# Patient Record
Sex: Male | Born: 1945 | Race: White | Hispanic: No | Marital: Married | Smoking: Never smoker
Health system: Southern US, Community
[De-identification: ages and names within clinical notes are randomized; demographics above are authoritative.]

## PROBLEM LIST (undated history)

## (undated) DIAGNOSIS — E7800 Pure hypercholesterolemia, unspecified: Secondary | ICD-10-CM

## (undated) DIAGNOSIS — I10 Essential (primary) hypertension: Secondary | ICD-10-CM

## (undated) DIAGNOSIS — E119 Type 2 diabetes mellitus without complications: Secondary | ICD-10-CM

## (undated) HISTORY — PX: COLONOSCOPY: SHX174

---

## 2005-07-31 ENCOUNTER — Ambulatory Visit: Payer: Self-pay | Admitting: Internal Medicine

## 2005-08-16 ENCOUNTER — Ambulatory Visit: Payer: Self-pay | Admitting: Internal Medicine

## 2005-10-02 ENCOUNTER — Ambulatory Visit: Payer: Self-pay | Admitting: Gastroenterology

## 2005-10-26 ENCOUNTER — Encounter (INDEPENDENT_AMBULATORY_CARE_PROVIDER_SITE_OTHER): Payer: Self-pay | Admitting: *Deleted

## 2005-10-26 ENCOUNTER — Ambulatory Visit (HOSPITAL_COMMUNITY): Admission: RE | Admit: 2005-10-26 | Discharge: 2005-10-26 | Payer: Self-pay | Admitting: Gastroenterology

## 2009-07-08 ENCOUNTER — Encounter: Admission: RE | Admit: 2009-07-08 | Discharge: 2009-07-08 | Payer: Self-pay | Admitting: Family Medicine

## 2009-08-11 ENCOUNTER — Encounter: Admission: RE | Admit: 2009-08-11 | Discharge: 2009-08-11 | Payer: Self-pay | Admitting: Family Medicine

## 2013-04-03 ENCOUNTER — Ambulatory Visit
Admission: RE | Admit: 2013-04-03 | Discharge: 2013-04-03 | Disposition: A | Discharge status: Home / Self Care | Payer: BC Managed Care – PPO | Source: Ambulatory Visit | Attending: Family Medicine | Admitting: Family Medicine

## 2013-04-03 ENCOUNTER — Other Ambulatory Visit: Payer: Self-pay | Admitting: Family Medicine

## 2013-04-03 DIAGNOSIS — M25562 Pain in left knee: Secondary | ICD-10-CM

## 2014-03-22 ENCOUNTER — Other Ambulatory Visit: Payer: Self-pay | Admitting: Family Medicine

## 2015-04-29 ENCOUNTER — Ambulatory Visit: Payer: Self-pay | Admitting: *Deleted

## 2015-12-06 ENCOUNTER — Telehealth: Payer: Self-pay | Admitting: Internal Medicine

## 2015-12-06 ENCOUNTER — Encounter: Payer: Self-pay | Admitting: Internal Medicine

## 2015-12-06 NOTE — Telephone Encounter (Signed)
I tried to call patient to inform him that he is due for recall colon but both #s on file are incorrect. Letter sent.

## 2016-07-17 ENCOUNTER — Other Ambulatory Visit: Payer: Self-pay | Admitting: Gastroenterology

## 2016-07-17 DIAGNOSIS — K7469 Other cirrhosis of liver: Secondary | ICD-10-CM

## 2016-07-18 ENCOUNTER — Other Ambulatory Visit: Payer: Self-pay | Admitting: Gastroenterology

## 2018-01-06 ENCOUNTER — Other Ambulatory Visit: Payer: Self-pay | Admitting: Gastroenterology

## 2018-01-06 DIAGNOSIS — B1710 Acute hepatitis C without hepatic coma: Secondary | ICD-10-CM

## 2018-01-10 ENCOUNTER — Ambulatory Visit (HOSPITAL_COMMUNITY): Payer: Self-pay

## 2018-01-13 ENCOUNTER — Ambulatory Visit (HOSPITAL_COMMUNITY)
Admission: RE | Admit: 2018-01-13 | Discharge: 2018-01-13 | Disposition: A | Discharge status: Home / Self Care | Payer: Medicare Other | Source: Ambulatory Visit | Attending: Gastroenterology | Admitting: Gastroenterology

## 2018-01-13 DIAGNOSIS — B1710 Acute hepatitis C without hepatic coma: Secondary | ICD-10-CM | POA: Insufficient documentation

## 2018-01-29 ENCOUNTER — Other Ambulatory Visit: Payer: Self-pay | Admitting: Family Medicine

## 2018-01-30 ENCOUNTER — Other Ambulatory Visit: Payer: Self-pay | Admitting: Family Medicine

## 2018-08-20 ENCOUNTER — Other Ambulatory Visit: Payer: Self-pay | Admitting: Family Medicine

## 2018-08-20 DIAGNOSIS — Z136 Encounter for screening for cardiovascular disorders: Secondary | ICD-10-CM

## 2019-10-18 ENCOUNTER — Emergency Department (HOSPITAL_COMMUNITY): Payer: Medicare Other

## 2019-10-18 ENCOUNTER — Other Ambulatory Visit: Payer: Self-pay

## 2019-10-18 ENCOUNTER — Observation Stay (HOSPITAL_COMMUNITY)
Admission: EM | Admit: 2019-10-18 | Discharge: 2019-10-19 | Disposition: A | Discharge status: Home / Self Care | Payer: Medicare Other | Attending: Internal Medicine | Admitting: Internal Medicine

## 2019-10-18 ENCOUNTER — Encounter (HOSPITAL_COMMUNITY): Payer: Self-pay | Admitting: Emergency Medicine

## 2019-10-18 DIAGNOSIS — I160 Hypertensive urgency: Secondary | ICD-10-CM | POA: Diagnosis not present

## 2019-10-18 DIAGNOSIS — J341 Cyst and mucocele of nose and nasal sinus: Secondary | ICD-10-CM | POA: Insufficient documentation

## 2019-10-18 DIAGNOSIS — D72829 Elevated white blood cell count, unspecified: Secondary | ICD-10-CM | POA: Diagnosis present

## 2019-10-18 DIAGNOSIS — I700 Atherosclerosis of aorta: Secondary | ICD-10-CM | POA: Diagnosis not present

## 2019-10-18 DIAGNOSIS — Z9114 Patient's other noncompliance with medication regimen: Secondary | ICD-10-CM | POA: Diagnosis not present

## 2019-10-18 DIAGNOSIS — Z7982 Long term (current) use of aspirin: Secondary | ICD-10-CM | POA: Diagnosis not present

## 2019-10-18 DIAGNOSIS — R4701 Aphasia: Secondary | ICD-10-CM | POA: Diagnosis present

## 2019-10-18 DIAGNOSIS — R262 Difficulty in walking, not elsewhere classified: Secondary | ICD-10-CM | POA: Diagnosis not present

## 2019-10-18 DIAGNOSIS — Z7984 Long term (current) use of oral hypoglycemic drugs: Secondary | ICD-10-CM | POA: Diagnosis not present

## 2019-10-18 DIAGNOSIS — Z79899 Other long term (current) drug therapy: Secondary | ICD-10-CM | POA: Diagnosis not present

## 2019-10-18 DIAGNOSIS — R519 Headache, unspecified: Secondary | ICD-10-CM

## 2019-10-18 DIAGNOSIS — Z888 Allergy status to other drugs, medicaments and biological substances status: Secondary | ICD-10-CM | POA: Insufficient documentation

## 2019-10-18 DIAGNOSIS — E7800 Pure hypercholesterolemia, unspecified: Secondary | ICD-10-CM | POA: Insufficient documentation

## 2019-10-18 DIAGNOSIS — Z823 Family history of stroke: Secondary | ICD-10-CM | POA: Diagnosis not present

## 2019-10-18 DIAGNOSIS — Z880 Allergy status to penicillin: Secondary | ICD-10-CM | POA: Insufficient documentation

## 2019-10-18 DIAGNOSIS — Z20822 Contact with and (suspected) exposure to covid-19: Secondary | ICD-10-CM | POA: Diagnosis not present

## 2019-10-18 DIAGNOSIS — E785 Hyperlipidemia, unspecified: Secondary | ICD-10-CM | POA: Diagnosis not present

## 2019-10-18 DIAGNOSIS — E119 Type 2 diabetes mellitus without complications: Secondary | ICD-10-CM

## 2019-10-18 DIAGNOSIS — E1151 Type 2 diabetes mellitus with diabetic peripheral angiopathy without gangrene: Secondary | ICD-10-CM | POA: Insufficient documentation

## 2019-10-18 DIAGNOSIS — E1165 Type 2 diabetes mellitus with hyperglycemia: Secondary | ICD-10-CM

## 2019-10-18 DIAGNOSIS — G459 Transient cerebral ischemic attack, unspecified: Secondary | ICD-10-CM | POA: Diagnosis not present

## 2019-10-18 DIAGNOSIS — I10 Essential (primary) hypertension: Secondary | ICD-10-CM | POA: Diagnosis not present

## 2019-10-18 DIAGNOSIS — I161 Hypertensive emergency: Secondary | ICD-10-CM | POA: Diagnosis present

## 2019-10-18 HISTORY — DX: Pure hypercholesterolemia, unspecified: E78.00

## 2019-10-18 HISTORY — DX: Type 2 diabetes mellitus without complications: E11.9

## 2019-10-18 HISTORY — DX: Essential (primary) hypertension: I10

## 2019-10-18 LAB — COMPREHENSIVE METABOLIC PANEL
ALT: 18 U/L (ref 0–44)
AST: 21 U/L (ref 15–41)
Albumin: 4.4 g/dL (ref 3.5–5.0)
Alkaline Phosphatase: 51 U/L (ref 38–126)
Anion gap: 13 (ref 5–15)
BUN: 15 mg/dL (ref 8–23)
CO2: 23 mmol/L (ref 22–32)
Calcium: 9.5 mg/dL (ref 8.9–10.3)
Chloride: 99 mmol/L (ref 98–111)
Creatinine, Ser: 0.91 mg/dL (ref 0.61–1.24)
GFR calc Af Amer: >60 mL/min (ref >60)
GFR calc non Af Amer: >60 mL/min (ref >60)
Glucose, Bld: 203 mg/dL — ABNORMAL HIGH (ref 70–99)
Potassium: 4.3 mmol/L (ref 3.5–5.1)
Sodium: 135 mmol/L (ref 135–145)
Total Bilirubin: 1.2 mg/dL (ref 0.3–1.2)
Total Protein: 7.7 g/dL (ref 6.5–8.1)

## 2019-10-18 LAB — DIFFERENTIAL
Abs Immature Granulocytes: 0.06 10*3/uL (ref 0.00–0.07)
Basophils Absolute: 0 10*3/uL (ref 0.0–0.1)
Basophils Relative: 0 %
Eosinophils Absolute: 0 10*3/uL (ref 0.0–0.5)
Eosinophils Relative: 0 %
Immature Granulocytes: 0 %
Lymphocytes Relative: 10 %
Lymphs Abs: 1.3 10*3/uL (ref 0.7–4.0)
Monocytes Absolute: 0.7 10*3/uL (ref 0.1–1.0)
Monocytes Relative: 5 %
Neutro Abs: 11.8 10*3/uL — ABNORMAL HIGH (ref 1.7–7.7)
Neutrophils Relative %: 85 %

## 2019-10-18 LAB — CBC
HCT: 46 % (ref 39.0–52.0)
Hemoglobin: 15.4 g/dL (ref 13.0–17.0)
MCH: 29.8 pg (ref 26.0–34.0)
MCHC: 33.5 g/dL (ref 30.0–36.0)
MCV: 89 fL (ref 80.0–100.0)
Platelets: 186 10*3/uL (ref 150–400)
RBC: 5.17 MIL/uL (ref 4.22–5.81)
RDW: 12.4 % (ref 11.5–15.5)
WBC: 13.9 10*3/uL — ABNORMAL HIGH (ref 4.0–10.5)
nRBC: 0 % (ref 0.0–0.2)

## 2019-10-18 LAB — TROPONIN I (HIGH SENSITIVITY)
Troponin I (High Sensitivity): 6 ng/L (ref <18)
Troponin I (High Sensitivity): 5 ng/L (ref <18)

## 2019-10-18 LAB — PROTIME-INR
INR: 1 (ref 0.8–1.2)
Prothrombin Time: 12.5 seconds (ref 11.4–15.2)

## 2019-10-18 LAB — CBG MONITORING, ED: Glucose-Capillary: 164 mg/dL — ABNORMAL HIGH (ref 70–99)

## 2019-10-18 LAB — SARS CORONAVIRUS 2 BY RT PCR (HOSPITAL ORDER, PERFORMED IN ~~LOC~~ HOSPITAL LAB): SARS Coronavirus 2: NEGATIVE

## 2019-10-18 MED ORDER — ACETAMINOPHEN 325 MG PO TABS
650.0000 mg | ORAL_TABLET | Freq: Once | ORAL | Status: AC
  Administered 2019-10-18: 650 mg via ORAL
  Filled 2019-10-18: qty 2

## 2019-10-18 MED ORDER — LORAZEPAM 1 MG PO TABS
1.0000 mg | ORAL_TABLET | Freq: Once | ORAL | Status: AC
  Administered 2019-10-18: 1 mg via SUBLINGUAL
  Filled 2019-10-18: qty 1

## 2019-10-18 MED ORDER — INSULIN ASPART 100 UNIT/ML ~~LOC~~ SOLN
0.0000 [IU] | Freq: Every day | SUBCUTANEOUS | Status: DC
  Administered 2019-10-18: 2 [IU] via SUBCUTANEOUS

## 2019-10-18 MED ORDER — HYDRALAZINE HCL 20 MG/ML IJ SOLN
10.0000 mg | INTRAMUSCULAR | Status: DC | PRN
  Administered 2019-10-18: 10 mg via INTRAVENOUS
  Filled 2019-10-18: qty 1

## 2019-10-18 MED ORDER — SODIUM CHLORIDE 0.9% FLUSH
3.0000 mL | Freq: Once | INTRAVENOUS | Status: AC
  Administered 2019-10-18: 3 mL via INTRAVENOUS

## 2019-10-18 MED ORDER — INSULIN ASPART 100 UNIT/ML ~~LOC~~ SOLN
0.0000 [IU] | Freq: Three times a day (TID) | SUBCUTANEOUS | Status: DC
  Administered 2019-10-19: 2 [IU] via SUBCUTANEOUS
  Administered 2019-10-19: 1 [IU] via SUBCUTANEOUS

## 2019-10-18 MED ORDER — LABETALOL HCL 5 MG/ML IV SOLN
10.0000 mg | Freq: Once | INTRAVENOUS | Status: AC
  Administered 2019-10-18: 10 mg via INTRAVENOUS
  Filled 2019-10-18: qty 4

## 2019-10-18 MED ORDER — STROKE: EARLY STAGES OF RECOVERY BOOK
Freq: Once | Status: AC
  Filled 2019-10-18: qty 1

## 2019-10-18 MED ORDER — ATORVASTATIN CALCIUM 80 MG PO TABS
80.0000 mg | ORAL_TABLET | Freq: Every day | ORAL | Status: DC
  Administered 2019-10-18 – 2019-10-19 (×2): 80 mg via ORAL
  Filled 2019-10-18 (×2): qty 1

## 2019-10-18 MED ORDER — ACETAMINOPHEN 160 MG/5ML PO SOLN: 650.0000 mg | ORAL | Status: DC | PRN

## 2019-10-18 MED ORDER — ENOXAPARIN SODIUM 40 MG/0.4ML ~~LOC~~ SOLN
40.0000 mg | SUBCUTANEOUS | Status: DC
  Administered 2019-10-18: 40 mg via SUBCUTANEOUS
  Filled 2019-10-18: qty 0.4

## 2019-10-18 MED ORDER — ACETAMINOPHEN 650 MG RE SUPP: 650.0000 mg | RECTAL | Status: DC | PRN

## 2019-10-18 MED ORDER — ASPIRIN 325 MG PO TABS
325.0000 mg | ORAL_TABLET | Freq: Every day | ORAL | Status: DC
  Administered 2019-10-18 – 2019-10-19 (×2): 325 mg via ORAL
  Filled 2019-10-18 (×2): qty 1

## 2019-10-18 MED ORDER — LABETALOL HCL 5 MG/ML IV SOLN
20.0000 mg | Freq: Once | INTRAVENOUS | Status: AC
  Administered 2019-10-18: 20 mg via INTRAVENOUS
  Filled 2019-10-18: qty 4

## 2019-10-18 MED ORDER — ACETAMINOPHEN 325 MG PO TABS
650.0000 mg | ORAL_TABLET | ORAL | Status: DC | PRN
  Administered 2019-10-18: 650 mg via ORAL
  Filled 2019-10-18: qty 2

## 2019-10-18 NOTE — ED Triage Notes (Addendum)
C/o headache and nausea since last night.  No neuro deficits noted on triage exam.  Hypertensive.

## 2019-10-18 NOTE — H&P (Addendum)
History and Physical    Thomas Knapp PJA:[id number] DOB: 07-25-1945 DOA: 10/18/2019  PCP: Bernerd Limbo, MD Patient coming from: Home  Chief Complaint: Headache, expressive aphasia  HPI: Thomas Knapp is a 74 y.o. male with medical history significant of diabetes, hypertension, hyperlipidemia presenting with complaints of headache and expressive aphasia.  Patient states his blood pressure has been running very high at home.  Yesterday it was 165/ 102.  Yesterday around noon when he tried to speak he was having difficulty getting words out.  Wife at bedside states she is not sure about facial droop but noticed a slight shadow or crack on the patient's right cheek at that time.  No focal weakness or numbness.  He continued to have difficulty with speech all day.  At night around bedtime he started having a severe headache.  This morning when he woke up his speech has gone back to normal but the headache persisted and he felt nauseous.  He is still having a slight headache at this time.  Denies any fevers, chills, cough, shortness of breath, chest pain, vomiting, abdominal pain, diarrhea, or diarrhea.  States he takes aspirin on occasion for headaches.  He takes Lipitor at home for high cholesterol but is not sure about the dose.  Unclear what he takes for hypertension.  For his diabetes, he takes Metformin and another oral medication, not sure about the name.  He is not on insulin.  Wife states patient's blood glucose has been running in the 200s.  ED Course: Blood pressure significantly elevated with systolic in the 341P on arrival. Remainder of vital signs stable. WBC count mildly elevated at 13.9. High-sensitivity troponin checked twice negative. EKG without acute ischemic changes. Screening SARS-CoV-2 PCR test negative. Head CT negative for acute intracranial abnormality. Brain MRI pending. Patient was given a total of 20 mg labetalol and blood pressure improved. Patient admitted for TIA work-up.  Neurology consulted.  Review of Systems:  All systems reviewed and apart from history of presenting illness, are negative.  Past Medical History:  Diagnosis Date  . Diabetes mellitus without complication ([place])   . High cholesterol   . HTN (hypertension)     History reviewed. No pertinent surgical history.   reports that he has never smoked. He has never used smokeless tobacco. He reports previous alcohol use. He reports previous drug use.  Not on File  Family History  Problem Relation Age of Onset  . Stroke Mother     Prior to Admission medications   Not on File    Physical Exam: Vitals:   10/18/19 1830 10/18/19 1945 10/18/19 2000 10/18/19 2015  BP: (!) 148/92 (!) 161/96 (!) 167/93 (!) 190/99  Pulse: (!) 59 (!) 58 72 60  Resp: 18     Temp:      TempSrc:      SpO2: 96% 97% 95% 98%  Weight:      Height:        Physical Exam  Constitutional: He is oriented to person, place, and time. He appears well-developed and well-nourished. No distress.  HENT:  Head: Normocephalic.  Mouth/Throat: Oropharynx is clear and moist.  Eyes: Pupils are equal, round, and reactive to light. EOM are normal.  Cardiovascular: Normal rate, regular rhythm and intact distal pulses.  Pulmonary/Chest: Effort normal and breath sounds normal. No respiratory distress. He has no wheezes. He has no rales.  Abdominal: Soft. Bowel sounds are normal. He exhibits no distension. There is no abdominal tenderness. There is  no guarding.  Musculoskeletal:        General: No edema.     Cervical back: Neck supple.  Neurological: He is alert and oriented to person, place, and time. No cranial nerve deficit.  Speech fluent, tongue midline, no facial droop Strength 5 out of 5 in bilateral upper and lower extremities. Sensation to light touch intact throughout.  Skin: Skin is warm and dry. He is not diaphoretic.    Labs on Admission: I have personally reviewed following labs and imaging studies  CBC: Recent  Labs  Lab 10/18/19 1348  WBC 13.9*  NEUTROABS 11.8*  HGB 15.4  HCT 46.0  MCV 89.0  PLT 186   Basic Metabolic Panel: Recent Labs  Lab 10/18/19 1348  NA 135  K 4.3  CL 99  CO2 23  GLUCOSE 203*  BUN 15  CREATININE 0.91  CALCIUM 9.5   GFR: Estimated Creatinine Clearance: 79.7 mL/min (by C-G formula based on SCr of 0.91 mg/dL). Liver Function Tests: Recent Labs  Lab 10/18/19 1348  AST 21  ALT 18  ALKPHOS 51  BILITOT 1.2  PROT 7.7  ALBUMIN 4.4   No results for input(s): LIPASE, AMYLASE in the last 168 hours. No results for input(s): AMMONIA in the last 168 hours. Coagulation Profile: Recent Labs  Lab 10/18/19 1501  INR 1.0   Cardiac Enzymes: No results for input(s): CKTOTAL, CKMB, CKMBINDEX, TROPONINI in the last 168 hours. BNP (last 3 results) No results for input(s): PROBNP in the last 8760 hours. HbA1C: No results for input(s): HGBA1C in the last 72 hours. CBG: No results for input(s): GLUCAP in the last 168 hours. Lipid Profile: No results for input(s): CHOL, HDL, LDLCALC, TRIG, CHOLHDL, LDLDIRECT in the last 72 hours. Thyroid Function Tests: No results for input(s): TSH, T4TOTAL, FREET4, T3FREE, THYROIDAB in the last 72 hours. Anemia Panel: No results for input(s): VITAMINB12, FOLATE, FERRITIN, TIBC, IRON, RETICCTPCT in the last 72 hours. Urine analysis: No results found for: COLORURINE, APPEARANCEUR, LABSPEC, PHURINE, GLUCOSEU, HGBUR, BILIRUBINUR, KETONESUR, PROTEINUR, UROBILINOGEN, NITRITE, LEUKOCYTESUR  Radiological Exams on Admission: CT Head Wo Contrast  Result Date: 10/18/2019 CLINICAL DATA:  Headache and elevated blood pressure. EXAM: CT HEAD WITHOUT CONTRAST TECHNIQUE: Contiguous axial images were obtained from the base of the skull through the vertex without intravenous contrast. COMPARISON:  None. FINDINGS: Brain: There is mild cerebral atrophy with widening of the extra-axial spaces and ventricular dilatation. There are areas of decreased  attenuation within the white matter tracts of the supratentorial brain, consistent with microvascular disease changes. Vascular: No hyperdense vessel or unexpected calcification. Skull: Normal. Negative for fracture or focal lesion. Sinuses/Orbits: No acute finding. Other: None. IMPRESSION: 1. Generalized cerebral atrophy. 2. No acute intracranial abnormality. Electronically Signed   By: Aram Candela M.D.   On: 10/18/2019 16:00   MR BRAIN WO CONTRAST  Result Date: 10/18/2019 CLINICAL DATA:  Initial evaluation for acute headache, TIA. EXAM: MRI HEAD WITHOUT CONTRAST TECHNIQUE: Multiplanar, multiecho pulse sequences of the brain and surrounding structures were obtained without intravenous contrast. COMPARISON:  Prior head CT from earlier the same day. FINDINGS: Brain: Mild age-related cerebral atrophy. Patchy T2/FLAIR hyperintensity within the periventricular deep white matter both cerebral hemispheres most consistent with chronic small vessel ischemic disease. Mild patchy involvement of the pons and cerebellum. No abnormal foci of restricted diffusion to suggest acute or subacute ischemia. Gray-white matter differentiation maintained. No encephalomalacia to suggest chronic cortical infarction. No evidence for acute intracranial hemorrhage. Small focus of susceptibility artifact within the  central pons related to a parenchymal calcification as seen on prior CT. No other evidence for acute or chronic intracranial hemorrhage. No mass lesion, midline shift or mass effect. No hydrocephalus or extra-axial fluid collection. No made of a partially empty sella. Midline structures intact. Vascular: Major intracranial vascular flow voids are well maintained. Skull and upper cervical spine: Craniocervical junction within normal limits. Bone marrow signal intensity normal. No scalp soft tissue abnormality. Sinuses/Orbits: Globes and orbital soft tissues within normal limits. Left maxillary sinus retention cyst noted.  Paranasal sinuses are otherwise clear. Trace left mastoid effusion noted, of doubtful significance. Inner ear structures grossly normal. Other: None. IMPRESSION: 1. No acute intracranial abnormality. 2. Mild age-related cerebral atrophy with chronic small vessel ischemic disease. Electronically Signed   By: Rise Mu M.D.   On: 10/18/2019 19:36    EKG: Independently reviewed. Sinus rhythm, no acute ischemic changes.  Assessment/Plan Principal Problem:   TIA (transient ischemic attack) Active Problems:   Hypertensive emergency   Leukocytosis   Hyperlipidemia   Diabetes (HCC)   TIA: Patient presenting with complaints expressive aphasia and headaches.  Symptom onset 5/22 at around noon.  Expressive aphasia has now resolved. No focal neuro deficit on exam. Head CT negative for acute intracranial abnormality. Neurology consulted and patient has been admitted for TIA work-up. -Telemetry monitoring -Discussed with neurology, recommending allowing permissive hypertension up to systolic 180. Treat only if systolic is above 180. -MRI of the brain pending -2D echocardiogram -Hemoglobin A1c, fasting lipid panel -Aspirin 325 p.o. now and daily -Atorvastatin 80 mg now and daily -Frequent neurochecks -PT, OT, speech therapy. -N.p.o. until cleared by bedside swallow evaluation or formal speech evaluation  ADDENDUM: Brain MRI negative for acute intracranial abnormality.  Patient's presentation appears to be consistent with a TIA.  Hypertensive emergency: Blood pressure significantly elevated with systolic in the 230s on arrival.  Unclear which medications he takes at home.  Presenting with symptoms concerning for a TIA. After receiving a total of 20 mg labetalol his blood pressure has now improved with most recent systolic in the 140s. -Goal is to allow permissive hypertension up to 180 due to concern for TIA. Treat only if systolic is above 180. PRN hydralazine ordered.  Mild leukocytosis:  Likely reactive. No infectious signs or symptoms. -Repeat CBC in a.m.  Hyperlipidemia: Lipid panel ordered.  High intensity statin as above.  Noninsulin-dependent type II diabetes: Check A1c. Sliding scale insulin ACHS and CBG checks.  Pharmacy med rec pending.  DVT prophylaxis: Lovenox Code Status: Full code Family Communication: No family available at this time. Disposition Plan: Status is: Observation  The patient remains OBS appropriate and will d/c before 2 midnights.  Dispo: The patient is from: Home              Anticipated d/c is to: Home              Anticipated d/c date is: 2 days              Patient currently is not medically stable to d/c.  The medical decision making on this patient was of high complexity and the patient is at high risk for clinical deterioration, therefore this is a level 3 visit.  John Giovanni MD [facility]  If 7PM-7AM, please contact night-coverage [email or web address]  10/18/2019, 8:37 PM

## 2019-10-18 NOTE — ED Provider Notes (Signed)
Care of the patient was assumed from Terance Hart PA-C at 1534; see this physician's note for complete history of present illness, review of systems, and physical exam.  Briefly, the patient is a 74 y.o. male who presented to the ED with word finding difficulty yesterday (resolved), right-sided headache, nausea, hypertension.  History significant for DM2, untreated hypertension.  Plan at time of handoff:  -This patient is gotten labetalol. -Labs thus far unremarkable -Currently awaiting head CT -To do: --MRI, neuro consult, admit for TIA work-up.  MDM/ED Course: CT scan was unremarkable.  On my initial exam, the patient was sitting up in bed.  His wife was at bedside.  He reiterated the story of expressive aphasia for several hours yesterday evening.  The symptoms persisted until the time he went to bed.  Symptoms were resolved when he woke up this morning.  This morning, he had diffuse headache, nausea, and vomiting.  He denies any current nausea.  He still endorses mild headache.  Blood pressure is elevated.  The patient was given additional dose of labetalol for hypertension with SBP greater than 200.  MRI of brain was ordered.  Upon reassessment, blood pressure was improved.  Sublingual Ativan was given for recurrence of nausea.  Tylenol was given for headache.  Given the patient's concerning story for stroke/TIA, patient was admitted to hospitalist service.  Neurology was informed of the patient.  MRI showed no acute abnormalities.  Patient was admitted for further TIA work-up.  Patient seen in conjunction with the attending physician, Blane Ohara, MD, who participated in all aspects of the patient's care and was in agreement with the above plan.   Emergency Department Medication Summary: Medications  sodium chloride flush (NS) 0.9 % injection 3 mL (3 mLs Intravenous Given 10/18/19 1503)  labetalol (NORMODYNE) injection 10 mg (10 mg Intravenous Given 10/18/19 1503)   Clinical Impression: No  diagnosis found.   Gloris Manchester, MD 10/19/19 1328    Blane Ohara, MD 10/22/19 [phone]

## 2019-10-18 NOTE — ED Provider Notes (Signed)
[facility] EMERGENCY DEPARTMENT Provider Note   CSN: [id number] Arrival date & time: 10/18/19  1257     History Chief Complaint  Patient presents with  . Headache  . Nausea    Thomas Knapp is a 74 y.o. male with history of DM, HTN, HLD who presents with a headache and high blood pressure. He states that he is supposed to be on blood pressure medicine but hasn't tolerated it well in the past so has not taken any for years. Around noon yesterday something "hit him" and he was talking to his wife and couldn't get his words out. His words were coming out backwards. Later that afternoon he started to have a right sided headache which he describes as a "bad headache" like a hangover. He denies sudden onset. This morning he had a persistent headache although his speech problems had resolved. He had associated nausea. [facility] and [facility] told him to come to the ED because BP was 190/90. He denies dizziness, vision changes, paresthesias, weakness, chest pain, SOB, palpitations, abdominal pain, or vomiting. He states normally his BP runs around 140-150/90.  HPI     Past Medical History:  Diagnosis Date  . Diabetes mellitus without complication (HCC)   . High cholesterol     There are no problems to display for this patient.   History reviewed. No pertinent surgical history.     No family history on file.  Social History   Tobacco Use  . Smoking status: Never Smoker  . Smokeless tobacco: Never Used  Substance Use Topics  . Alcohol use: Not Currently  . Drug use: Not Currently    Home Medications Prior to Admission medications   Not on File    Allergies    Patient has no allergy information on record.  Review of Systems   Review of Systems  Constitutional: Negative for chills and fever.  Respiratory: Negative for shortness of breath.   Cardiovascular: Negative for chest pain.  Gastrointestinal: Positive for nausea. Negative for  abdominal pain and vomiting.  Musculoskeletal: Negative for back pain.  Neurological: Positive for speech difficulty and headaches. Negative for dizziness, tremors, seizures, syncope, facial asymmetry, weakness, light-headedness and numbness.  All other systems reviewed and are negative.   Physical Exam Updated Vital Signs BP (!) 213/113 (BP Location: Left Arm)   Pulse 77   Temp 98.1 F (36.7 C) (Oral)   Resp 18   Ht 5\' 6"  (1.676 m)   Wt 96.2 kg   SpO2 99%   BMI 34.22 kg/m   Physical Exam Vitals and nursing note reviewed.  Constitutional:      General: He is not in acute distress.    Appearance: Normal appearance. He is well-developed. He is not ill-appearing.     Comments: Calm, cooperative. NAD  HENT:     Head: Normocephalic and atraumatic.  Eyes:     General: No scleral icterus.       Right eye: No discharge.        Left eye: No discharge.     Conjunctiva/sclera: Conjunctivae normal.     Pupils: Pupils are equal, round, and reactive to light.  Cardiovascular:     Rate and Rhythm: Normal rate and regular rhythm.  Pulmonary:     Effort: Pulmonary effort is normal. No respiratory distress.     Breath sounds: Normal breath sounds.  Abdominal:     General: There is no distension.  Musculoskeletal:  Cervical back: Normal range of motion.  Skin:    General: Skin is warm and dry.  Neurological:     Mental Status: He is alert and oriented to person, place, and time.     Comments: Mental Status:  Alert, oriented, thought content appropriate, able to give a coherent history. Speech fluent without evidence of aphasia. Able to follow 2 step commands without difficulty.  Cranial Nerves:  II:  Peripheral visual fields grossly normal, pupils equal, round, reactive to light. Mild strabismus in the right eye III,IV, VI: ptosis not present, extra-ocular motions intact bilaterally  V,VII: smile symmetric, facial light touch sensation equal VIII: hearing grossly normal to voice    X: uvula elevates symmetrically  XI: bilateral shoulder shrug symmetric and strong XII: midline tongue extension without fassiculations Motor:  Normal tone. 5/5 in upper and lower extremities bilaterally including strong and equal grip strength and dorsiflexion/plantar flexion Sensory: Pinprick and light touch normal in all extremities.  Cerebellar: normal finger-to-nose with bilateral upper extremities Gait: not tested CV: distal pulses palpable throughout    Psychiatric:        Behavior: Behavior normal.     ED Results / Procedures / Treatments   Labs (all labs ordered are listed, but only abnormal results are displayed) Labs Reviewed  CBC - Abnormal; Notable for the following components:      Result Value   WBC 13.9 (*)    All other components within normal limits  DIFFERENTIAL - Abnormal; Notable for the following components:   Neutro Abs 11.8 (*)    All other components within normal limits  COMPREHENSIVE METABOLIC PANEL - Abnormal; Notable for the following components:   Glucose, Bld 203 (*)    All other components within normal limits  SARS CORONAVIRUS 2 BY RT PCR (HOSPITAL ORDER, PERFORMED IN  HOSPITAL LAB)  PROTIME-INR  TROPONIN I (HIGH SENSITIVITY)    EKG EKG Interpretation  Date/Time:  Sunday Oct 18 2019 14:31:13 EDT Ventricular Rate:  74 PR Interval:  164 QRS Duration: 99 QT Interval:  425 QTC Calculation: 472 R Axis:   -6 Text Interpretation: appears to be sinus rhythm, but poor tracing quality limits interpretation Confirmed by Pricilla Loveless [phone]) on 10/18/2019 2:50:33 PM   Radiology No results found.  Procedures Procedures (including critical care time)  Medications Ordered in ED Medications  sodium chloride flush (NS) 0.9 % injection 3 mL (3 mLs Intravenous Given 10/18/19 1503)  labetalol (NORMODYNE) injection 10 mg (10 mg Intravenous Given 10/18/19 1503)    ED Course  I have reviewed the triage vital signs and the nursing  notes.  Pertinent labs & imaging results that were available during my care of the patient were reviewed by me and considered in my medical decision making (see chart for details).  74 year old male presents with headache and HTN with some speech difficulty that occurred yesterday which has resolved. BP is significantly elevated here but otherwise vitals are normal. Neuro exam is non-focal. EKG is SR. Will order labs, CT head. Will order Labetolol. Shared visit with Dr. Criss Alvine.  At shift change work up is pending. Care signed out to Dr. Durwin Nora. Plan is likely admission for hypertensive emergency vs TIA.  MDM Rules/Calculators/A&P                       Final Clinical Impression(s) / ED Diagnoses Final diagnoses:  Acute nonintractable headache, unspecified headache type    Rx / DC Orders ED Discharge  Orders    None       Recardo Evangelist, PA-C 10/18/19 [place], MD 10/21/19 [phone]

## 2019-10-18 NOTE — Consult Note (Signed)
Referring Physician: Dr. Marlowe Sax    Chief Complaint: TIA symptoms  HPI: Thomas Knapp is a 74 y.o. male with a PMHx of DM, HTN and HLD who presented to the ED on Sunday afternoon with severe headache and high blood pressure. His symptoms began with sudden onset of speech deficit on Saturday, consisting of being unable to get his words out while speaking with his wife. He then started to experience a severe right sided headache, but states that it was not abrupt in onset; the headache at its worst was 9/10 and was associated with nausea. The headache persisted through Sunday AM. Speech problems had resolved by that time. He went to the clinic at a nearby drug store and was told to go to the ED due to elevated BP of 190/90.   On initial presentation to the ED, he denied dizziness, vision changes, paresthesias, weakness, chest pain, SOB, palpitations, abdominal pain, or vomiting.   At baseline, his BP usually runs at about 140-150/90. He stated that he has been noncompliant with his antihypertensive regimen for years.   CT head reveals no acute abnormality. There is generalized cerebral atrophy. A linear hyperdensity within the pontine tegmentum appears most consistent with a chronic calcification of uncertain etiology.   His headache is now decreased to a 1-2/10 after sleeping.   LSN: Saturday tPA Given: No: Symptoms resolved.   Past Medical History:  Diagnosis Date  . Diabetes mellitus without complication ([place])   . High cholesterol   HTN  History reviewed. No pertinent surgical history.  No family history on file. Social History:  reports that he has never smoked. He has never used smokeless tobacco. He reports previous alcohol use. He reports previous drug use.  Allergies: Not on File  Medications:  Prior to Admission:  Medications Prior to Admission  Medication Sig Dispense Refill Last Dose  . ascorbic acid (VITAMIN C) 500 MG tablet Take 500 mg by mouth daily.   10/17/2019 at  Unknown time  . atorvastatin (LIPITOR) 10 MG tablet Take 10 mg by mouth every evening.    10/17/2019 at Unknown time  . Coenzyme Q10 100 MG capsule Take 1 capsule by mouth daily.   10/17/2019 at Unknown time  . fexofenadine (ALLEGRA) 180 MG tablet Take 180 mg by mouth daily.   10/17/2019 at Unknown time  . fluticasone (FLONASE) 50 MCG/ACT nasal spray Place 1 spray into both nostrils daily.   10/17/2019 at Unknown time  . glipiZIDE (GLUCOTROL XL) 5 MG 24 hr tablet Take 5 mg by mouth daily.   10/17/2019 at Unknown time  . metFORMIN (GLUCOPHAGE-XR) 500 MG 24 hr tablet Take 500 mg by mouth 2 (two) times daily.   10/17/2019 at Unknown time  . omeprazole (PRILOSEC) 20 MG capsule Take 20 mg by mouth daily.   10/17/2019 at Unknown time  . VITAMIN D PO Take 1 tablet by mouth daily.   10/17/2019 at Unknown time   Scheduled: .  stroke: mapping our early stages of recovery book   Does not apply Once  . aspirin  325 mg Oral Daily  . atorvastatin  80 mg Oral Daily  . enoxaparin (LOVENOX) injection  40 mg Subcutaneous Q24H  . insulin aspart  0-5 Units Subcutaneous QHS  . insulin aspart  0-9 Units Subcutaneous TID WC    ROS: As per HPI. Comprehensive ROS otherwise negative.   Physical Examination: Blood pressure (!) 148/92, pulse (!) 59, temperature 99.4 F (37.4 C), temperature source Oral, resp. rate 18,  height 5\' 6"  (1.676 m), weight 96.2 kg, SpO2 96 %.  HEENT: [place]/AT Lungs: Respirations unlabored Ext: No edema  Neurologic Examination: Mental Status: Alert, oriented, thought content appropriate.  Speech fluent without evidence of aphasia.  Able to follow all commands without difficulty. Cranial Nerves: II:  Visual fields intact with no extinction to DSS. PERRL. III,IV, VI: No ptosis. 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 In this context, left EOM are normal and right EOM are consistent with an intermittent esotropia.  V,VII: Smile symmetric, facial temp  sensation equal bilaterally VIII: hearing intact to voice IX,X: No hypophonia or hoarseness XI: Symmetric XII: midline tongue extension  Motor: Right : Upper extremity   5/5    Left:     Upper extremity   5/5  Lower extremity   5/5     Lower extremity   5/5 No pronator drift Sensory: Temp and light touch intact throughout, bilaterally. No extinction to DSS.  Deep Tendon Reflexes:  Normoactive x 4.  Plantars: Right: downgoing   Left: downgoing Cerebellar: No ataxia with FNF bilaterally  Gait: Deferred  Results for orders placed or performed during the hospital encounter of 10/18/19 (from the past 48 hour(s))  CBC     Status: Abnormal   Collection Time: 10/18/19  1:48 PM  Result Value Ref Range   WBC 13.9 (H) 4.0 - 10.5 K/uL   RBC 5.17 4.22 - 5.81 MIL/uL   Hemoglobin 15.4 13.0 - 17.0 g/dL   HCT 10/20/19 63.8 - 45.3 %   MCV 89.0 80.0 - 100.0 fL   MCH 29.8 26.0 - 34.0 pg   MCHC 33.5 30.0 - 36.0 g/dL   RDW 64.6 80.3 - 21.2 %   Platelets 186 150 - 400 K/uL   nRBC 0.0 0.0 - 0.2 %    Comment: Performed at [facility], [address] [place], [place]  Differential     Status: Abnormal   Collection Time: 10/18/19  1:48 PM  Result Value Ref Range   Neutrophils Relative % 85 %   Neutro Abs 11.8 (H) 1.7 - 7.7 K/uL   Lymphocytes Relative 10 %   Lymphs Abs 1.3 0.7 - 4.0 K/uL   Monocytes Relative 5 %   Monocytes Absolute 0.7 0.1 - 1.0 K/uL   Eosinophils Relative 0 %   Eosinophils Absolute 0.0 0.0 - 0.5 K/uL   Basophils Relative 0 %   Basophils Absolute 0.0 0.0 - 0.1 K/uL   Immature Granulocytes 0 %   Abs Immature Granulocytes 0.06 0.00 - 0.07 K/uL    Comment: Performed at [facility], [address] [place], [place]  Comprehensive metabolic panel     Status: Abnormal   Collection Time: 10/18/19  1:48 PM  Result Value Ref Range   Sodium 135 135 - 145 mmol/L   Potassium 4.3 3.5 - 5.1 mmol/L   Chloride 99 98 - 111 mmol/L   CO2 23 22 - 32 mmol/L    Glucose, Bld 203 (H) 70 - 99 mg/dL    Comment: Glucose reference range applies only to samples taken after fasting for at least 8 hours.   BUN 15 8 - 23 mg/dL   Creatinine, Ser 10/20/19 0.61 - 1.24 mg/dL   Calcium 9.5 8.9 - 8.91 mg/dL   Total Protein 7.7 6.5 - 8.1 g/dL   Albumin 4.4 3.5 - 5.0 g/dL   AST 21 15 - 41 U/L   ALT 18  0 - 44 U/L   Alkaline Phosphatase 51 38 - 126 U/L   Total Bilirubin 1.2 0.3 - 1.2 mg/dL   GFR calc non Af Amer >60 >60 mL/min   GFR calc Af Amer >60 >60 mL/min   Anion gap 13 5 - 15    Comment: Performed at [facility], [address] [place], [place] [address]  Protime-INR     Status: None   Collection Time: 10/18/19  3:01 PM  Result Value Ref Range   Prothrombin Time 12.5 11.4 - 15.2 seconds   INR 1.0 0.8 - 1.2    Comment: (NOTE) INR goal varies based on device and disease states. Performed at [facility], [address] [place], [place] [address]   Troponin I (High Sensitivity)     Status: None   Collection Time: 10/18/19  3:01 PM  Result Value Ref Range   Troponin I (High Sensitivity) 6 <18 ng/L    Comment: (NOTE) Elevated high sensitivity troponin I (hsTnI) values and significant  changes across serial measurements may suggest ACS but many other  chronic and acute conditions are known to elevate hsTnI results.  Refer to the "Links" section for chest pain algorithms and additional  guidance. Performed at [facility], [address] [place], [place] [address]   SARS Coronavirus 2 by RT PCR (hospital order, performed in [facility] hospital lab) Nasopharyngeal Nasopharyngeal Swab     Status: None   Collection Time: 10/18/19  3:21 PM   Specimen: Nasopharyngeal Swab  Result Value Ref Range   SARS Coronavirus 2 NEGATIVE NEGATIVE    Comment: (NOTE) SARS-CoV-2 target nucleic acids are NOT DETECTED. The SARS-CoV-2 RNA is generally detectable in upper and lower respiratory specimens during the acute phase of infection. The  lowest concentration of SARS-CoV-2 viral copies this assay can detect is 250 copies / mL. A negative result does not preclude SARS-CoV-2 infection and should not be used as the sole basis for treatment or other patient management decisions.  A negative result may occur with improper specimen collection / handling, submission of specimen other than nasopharyngeal swab, presence of viral mutation(s) within the areas targeted by this assay, and inadequate number of viral copies (<250 copies / mL). A negative result must be combined with clinical observations, patient history, and epidemiological information. Fact Sheet for Patients:   BoilerBrush.com.cy Fact Sheet for Healthcare Providers: https://pope.com/ This test is not yet approved or cleared  by the [place] FDA and has been authorized for detection and/or diagnosis of SARS-CoV-2 by FDA under an Emergency Use Authorization (EUA).  This EUA will remain in effect (meaning this test can be used) for the duration of the COVID-19 declaration under Section 564(b)(1) of the Act, 21 U.S.C. section 360bbb-3(b)(1), unless the authorization is terminated or revoked sooner. Performed at [facility], [address] [place], [place] [address]   Troponin I (High Sensitivity)     Status: None   Collection Time: 10/18/19  4:41 PM  Result Value Ref Range   Troponin I (High Sensitivity) 5 <18 ng/L    Comment: (NOTE) Elevated high sensitivity troponin I (hsTnI) values and significant  changes across serial measurements may suggest ACS but many other  chronic and acute conditions are known to elevate hsTnI results.  Refer to the "Links" section for chest pain algorithms and additional  guidance. Performed at [facility], [address], [place], [place] [address]    CT Head  Wo Contrast  Result Date: 10/18/2019 CLINICAL DATA:  Headache and elevated blood pressure. EXAM: CT HEAD  WITHOUT CONTRAST TECHNIQUE: Contiguous axial images were obtained from the base of the skull through the vertex without intravenous contrast. COMPARISON:  None. FINDINGS: Brain: There is mild cerebral atrophy with widening of the extra-axial spaces and ventricular dilatation. There are areas of decreased attenuation within the white matter tracts of the supratentorial brain, consistent with microvascular disease changes. Vascular: No hyperdense vessel or unexpected calcification. Skull: Normal. Negative for fracture or focal lesion. Sinuses/Orbits: No acute finding. Other: None. IMPRESSION: 1. Generalized cerebral atrophy. 2. No acute intracranial abnormality. Electronically Signed   By: Aram Candela M.D.   On: 10/18/2019 16:00    Assessment: 74 y.o. male presenting with TIA symptoms and headache.  1. Exam is uremarkable. 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  2. CT head reveals no acute abnormality. There is generalized cerebral atrophy. A linear hyperdensity within the pontine tegmentum appears most consistent with a chronic calcification of uncertain etiology.  3. MRI brain: No acute intracranial abnormality. Mild age-related cerebral atrophy with chronic small vessel ischemic disease. 4. Stroke Risk Factors - DM, hypercholesterolemia and HTN  Recommendations: 1. HgbA1c, fasting lipid panel 2. MRA of the brain without contrast 3. PT consult, OT consult, Speech consult 4. Echocardiogram 5. Carotid dopplers 6. Agree with starting daily ASA (he takes only occasionally at home).  7. Risk factor modification 8. Telemetry monitoring 9. Frequent neuro checks 10. Agree with increasing atorvastatin home dose. Obtaining baseline CK level (ordered).  11. Permissive HTN until Monday AM. Will need to be gradually started on an antihypertensive regimen with long-term goal of 120/80. Will need to address reasons for noncompliance with the patient.   @Electronically  signed: Dr. 10/18/2019, 7:27 PM

## 2019-10-18 NOTE — ED Notes (Signed)
Pt's BP got up too 200/92. 10 mg of hydralyzine IV given per PRN MD orders. BP recheck 178/88.

## 2019-10-18 NOTE — ED Notes (Signed)
Pt transported to MRI 

## 2019-10-19 ENCOUNTER — Encounter (HOSPITAL_COMMUNITY): Payer: Self-pay | Admitting: Internal Medicine

## 2019-10-19 ENCOUNTER — Observation Stay (HOSPITAL_BASED_OUTPATIENT_CLINIC_OR_DEPARTMENT_OTHER): Payer: Medicare Other

## 2019-10-19 ENCOUNTER — Observation Stay (HOSPITAL_COMMUNITY): Payer: Medicare Other

## 2019-10-19 DIAGNOSIS — G459 Transient cerebral ischemic attack, unspecified: Secondary | ICD-10-CM

## 2019-10-19 DIAGNOSIS — I160 Hypertensive urgency: Secondary | ICD-10-CM | POA: Diagnosis not present

## 2019-10-19 LAB — LIPID PANEL
Cholesterol: 135 mg/dL (ref 0–200)
HDL: 36 mg/dL — ABNORMAL LOW (ref >40)
LDL Cholesterol: 70 mg/dL (ref 0–99)
Total CHOL/HDL Ratio: 3.8 RATIO
Triglycerides: 144 mg/dL (ref <150)
VLDL: 29 mg/dL (ref 0–40)

## 2019-10-19 LAB — CBC
HCT: 42.7 % (ref 39.0–52.0)
Hemoglobin: 14.3 g/dL (ref 13.0–17.0)
MCH: 29.4 pg (ref 26.0–34.0)
MCHC: 33.5 g/dL (ref 30.0–36.0)
MCV: 87.7 fL (ref 80.0–100.0)
Platelets: 176 10*3/uL (ref 150–400)
RBC: 4.87 MIL/uL (ref 4.22–5.81)
RDW: 12.5 % (ref 11.5–15.5)
WBC: 10.4 10*3/uL (ref 4.0–10.5)
nRBC: 0 % (ref 0.0–0.2)

## 2019-10-19 LAB — GLUCOSE, CAPILLARY
Glucose-Capillary: 172 mg/dL — ABNORMAL HIGH (ref 70–99)
Glucose-Capillary: 138 mg/dL — ABNORMAL HIGH (ref 70–99)
Glucose-Capillary: 115 mg/dL — ABNORMAL HIGH (ref 70–99)

## 2019-10-19 LAB — ECHOCARDIOGRAM COMPLETE
Height: 66 in
Weight: 3392 oz

## 2019-10-19 LAB — CK: Total CK: 115 U/L (ref 49–397)

## 2019-10-19 LAB — HEMOGLOBIN A1C
Hgb A1c MFr Bld: 7 % — ABNORMAL HIGH (ref 4.8–5.6)
Mean Plasma Glucose: 154.2 mg/dL

## 2019-10-19 MED ORDER — CLOPIDOGREL BISULFATE 75 MG PO TABS: 75.0000 mg | ORAL_TABLET | Freq: Every day | ORAL | 0 refills | Status: AC

## 2019-10-19 MED ORDER — IOHEXOL 350 MG/ML SOLN
75.0000 mL | Freq: Once | INTRAVENOUS | Status: AC | PRN
  Administered 2019-10-19: 75 mL via INTRAVENOUS

## 2019-10-19 MED ORDER — METFORMIN HCL ER 500 MG PO TB24: 500.0000 mg | ORAL_TABLET | Freq: Two times a day (BID) | ORAL | Status: AC

## 2019-10-19 MED ORDER — ASPIRIN EC 81 MG PO TBEC: 81.0000 mg | DELAYED_RELEASE_TABLET | Freq: Every day | ORAL | Status: DC

## 2019-10-19 MED ORDER — AMLODIPINE BESYLATE 5 MG PO TABS
5.0000 mg | ORAL_TABLET | Freq: Every day | ORAL | 0 refills | Status: AC
Start: 2019-10-19 — End: 2020-10-18

## 2019-10-19 MED ORDER — CLOPIDOGREL BISULFATE 75 MG PO TABS
75.0000 mg | ORAL_TABLET | Freq: Every day | ORAL | Status: DC
  Administered 2019-10-19: 75 mg via ORAL
  Filled 2019-10-19: qty 1

## 2019-10-19 MED ORDER — ASPIRIN 81 MG PO TBEC: 81.0000 mg | DELAYED_RELEASE_TABLET | Freq: Every day | ORAL | Status: AC

## 2019-10-19 MED ORDER — LOSARTAN POTASSIUM 25 MG PO TABS
25.0000 mg | ORAL_TABLET | Freq: Every day | ORAL | Status: DC
  Administered 2019-10-19: 25 mg via ORAL
  Filled 2019-10-19: qty 1

## 2019-10-19 NOTE — Progress Notes (Signed)
Progress Note    Thomas Knapp  [id number] DOB: 11/02/1945  DOA: 10/18/2019 PCP: Tracey HarriesBouska, David, MD    Brief Narrative:     Medical records reviewed and are as summarized below:  Thomas Knapp is an 74 y.o. male with a past medical history that includes diabetes, hypertension, hyperlipidemia admitted May 23 for hypertensive emergency and TIA work-up.  Neuro on board.  Assessment/Plan:   Principal Problem:   TIA (transient ischemic attack) Active Problems:   Hypertensive emergency   Leukocytosis   Diabetes (HCC)   Hyperlipidemia   #1.  TIA.  CT of the head reveals no acute abnormality.  MRI of the brain no acute intracranial abnormality.  Risk factors include diabetes, hypertension, hypercholesterolemia.  Evaluated by neurology who recommend TIA work-up and increasing statin.  Echo results pending.  KG with normal sinus rhythm nonspecific ST abnormality.  Lipid panel with HDL 36 otherwise within the limits of normal, hemoglobin A1c 7.0, CK within the limits of normal, sensitivity troponins negative x2.  Evaluated by PT and no follow-up recommended.  Symptoms resolved this AM. -Plavix and aspirin per neuro -Follow echo results -follow CT angio head and neck -Further work-up/follow-up per nephrology  #2.  Hypertension/hypertensive emergency.  Patient with a long history of noncompliance with his antihypertensive medications.  Systolic pressure 230 upon presentation.  He states that hypertensive meds in the past have "given me diarrhea or made me sick to my stomach or something".  He received 20 mg of labetalol in the emergency department.  Chart review indicates he was on losartan in the past.  Neurology recommended permissive hypertension till Monday morning -Resume former losartan -Monitor blood pressure closely -We will need close outpatient follow-up and monitoring for compliance  #3. Leukocytosis. Mild. Resolved this am. No fever  #4.  Diabetes type 2.   Non-insulin-dependent.  Hemoglobin A1c 7.0.  Serum glucose 203 on admission.  Home medications include Metformin and glipizide -Sliding scale insulin for optimal control -Hold home meds for now  #5.  Hyperlipidemia.  Lipid panel as noted above.  Home medications include Lipitor 10 mg.  This dose was increased to 80 mg -Continue higher dose  Family Communication/Anticipated D/C date and plan/Code Status   DVT prophylaxis: Lovenox ordered. Code Status: Full Code.  Family Communication: patient at bedside Disposition Plan: Status is: Observation  The patient remains OBS appropriate and will d/c before 2 midnights.  Dispo: The patient is from: Home              Anticipated d/c is to: Home              Anticipated d/c date is: 1 day              Patient currently is not medically stable to d/c.         Medical Consultants:    neuro   Anti-Infectives:    None  Subjective:   Sitting up in bed watching TV.  Denies pain or discomfort.  Reports symptoms resolved back to baseline  Objective:    Vitals:   10/18/19 2137 10/19/19 0008 10/19/19 0440 10/19/19 1227  BP: (!) 182/80 (!) 171/85 (!) 151/88 (!) 151/84  Pulse: 62 64 65 63  Resp: 16 16 16 16   Temp: 98.1 F (36.7 C) 97.7 F (36.5 C) 97.8 F (36.6 C) 97.7 F (36.5 C)  TempSrc: Oral Oral Oral Oral  SpO2: 99% 96% 96% 98%  Weight:      Height:  No intake or output data in the 24 hours ending 10/19/19 1321 Filed Weights   10/18/19 1320  Weight: 96.2 kg    Exam: General: Well-nourished awake no acute distress CV: Regular rate and rhythm no murmur gallop or rub no lower extremity edema Respiratory: No increased work of breathing breath sounds clear bilaterally I hear no wheeze no rhonchi Abdomen: Nondistended soft positive bowel sounds throughout nontender to palpation no guarding or rebounding Musculoskeletal: Joints without swelling/erythema full range of motion Neuro: Awake alert oriented x3 speech  clear facial symmetry tongue midline bilateral grip 5 out of 5 bilateral lower extremity strength 5 out of 5  Data Reviewed:   I have personally reviewed following labs and imaging studies:  Labs: Labs show the following:   Basic Metabolic Panel: Recent Labs  Lab 10/18/19 1348  NA 135  K 4.3  CL 99  CO2 23  GLUCOSE 203*  BUN 15  CREATININE 0.91  CALCIUM 9.5   GFR Estimated Creatinine Clearance: 79.7 mL/min (by C-G formula based on SCr of 0.91 mg/dL). Liver Function Tests: Recent Labs  Lab 10/18/19 1348  AST 21  ALT 18  ALKPHOS 51  BILITOT 1.2  PROT 7.7  ALBUMIN 4.4   No results for input(s): LIPASE, AMYLASE in the last 168 hours. No results for input(s): AMMONIA in the last 168 hours. Coagulation profile Recent Labs  Lab 10/18/19 1501  INR 1.0    CBC: Recent Labs  Lab 10/18/19 1348 10/19/19 0442  WBC 13.9* 10.4  NEUTROABS 11.8*  --   HGB 15.4 14.3  HCT 46.0 42.7  MCV 89.0 87.7  PLT 186 176   Cardiac Enzymes: Recent Labs  Lab 10/19/19 0442  CKTOTAL 115   BNP (last 3 results) No results for input(s): PROBNP in the last 8760 hours. CBG: Recent Labs  Lab 10/18/19 2142 10/19/19 0648 10/19/19 1111  GLUCAP 164* 172* 138*   D-Dimer: No results for input(s): DDIMER in the last 72 hours. Hgb A1c: Recent Labs    10/19/19 0442  HGBA1C 7.0*   Lipid Profile: Recent Labs    10/19/19 0442  CHOL 135  HDL 36*  LDLCALC 70  TRIG 144  CHOLHDL 3.8   Thyroid function studies: No results for input(s): TSH, T4TOTAL, T3FREE, THYROIDAB in the last 72 hours.  Invalid input(s): FREET3 Anemia work up: No results for input(s): VITAMINB12, FOLATE, FERRITIN, TIBC, IRON, RETICCTPCT in the last 72 hours. Sepsis Labs: Recent Labs  Lab 10/18/19 1348 10/19/19 0442  WBC 13.9* 10.4    Microbiology Recent Results (from the past 240 hour(s))  SARS Coronavirus 2 by RT PCR (hospital order, performed in [facility] hospital lab) Nasopharyngeal  Nasopharyngeal Swab     Status: None   Collection Time: 10/18/19  3:21 PM   Specimen: Nasopharyngeal Swab  Result Value Ref Range Status   SARS Coronavirus 2 NEGATIVE NEGATIVE Final    Comment: (NOTE) SARS-CoV-2 target nucleic acids are NOT DETECTED. The SARS-CoV-2 RNA is generally detectable in upper and lower respiratory specimens during the acute phase of infection. The lowest concentration of SARS-CoV-2 viral copies this assay can detect is 250 copies / mL. A negative result does not preclude SARS-CoV-2 infection and should not be used as the sole basis for treatment or other patient management decisions.  A negative result may occur with improper specimen collection / handling, submission of specimen other than nasopharyngeal swab, presence of viral mutation(s) within the areas targeted by this assay, and inadequate number of viral copies (<  250 copies / mL). A negative result must be combined with clinical observations, patient history, and epidemiological information. Fact Sheet for Patients:   BoilerBrush.com.cy Fact Sheet for Healthcare Providers: https://pope.com/ This test is not yet approved or cleared  by the [place] FDA and has been authorized for detection and/or diagnosis of SARS-CoV-2 by FDA under an Emergency Use Authorization (EUA).  This EUA will remain in effect (meaning this test can be used) for the duration of the COVID-19 declaration under Section 564(b)(1) of the Act, 21 U.S.C. section 360bbb-3(b)(1), unless the authorization is terminated or revoked sooner. Performed at [facility], [address] [place], [place] [address]     Procedures and diagnostic studies:  CT ANGIO HEAD W OR WO CONTRAST  Result Date: 10/19/2019 CLINICAL DATA:  TIA. EXAM: CT ANGIOGRAPHY HEAD AND NECK TECHNIQUE: Multidetector CT imaging of the head and neck was performed using the standard protocol during bolus  administration of intravenous contrast. Multiplanar CT image reconstructions and MIPs were obtained to evaluate the vascular anatomy. Carotid stenosis measurements (when applicable) are obtained utilizing NASCET criteria, using the distal internal carotid diameter as the denominator. CONTRAST:  67mL OMNIPAQUE IOHEXOL 350 MG/ML SOLN COMPARISON:  None. FINDINGS: CTA NECK FINDINGS Aortic arch: Standard branching. Imaged portion shows no evidence of aneurysm or dissection. Atherosclerotic changes of the aortic arch are noted with calcified plaques at the origin of the major neck arteries, without hemodynamically significant stenosis. Irregular ulcerated soft plaque is noted distal to the origin of the left subclavian artery without significant stenosis. Right carotid system: Increased tortuosity of the right common carotid artery and cervical segment of the right internal carotid artery. Atherosclerotic changes are noted in right carotid bifurcation with mixed density plaque without hemodynamically significant stenosis. Left carotid system: Increased tortuosity of the left common carotid artery and cervical segment of the left internal carotid artery. Atherosclerotic changes are noted in left carotid bifurcation with mixed density plaque without hemodynamically significant stenosis. Vertebral arteries: Calcified plaque is seen in the right subclavian artery, just proximal to the origin of the right vertebral artery, without hemodynamically significant stenosis. The left vertebral artery is slightly dominant. Mixed density plaque is noted in the left subclavian artery, proximal and extending to the origin of the left vertebral artery resulting in mild stenosis. There is increased tortuosity of the V1 segment of the bilateral vertebral arteries, left greater than right. No evidence of dissection, high-grade stenosis or occlusion. Skeleton: Degenerative changes of the cervical spine are noted, more pronounced at C5-6 and  C6-7. No aggressive bone lesion identified. Other neck: Mucous retention cyst in the left maxillary sinus. Upper chest: Negative Review of the MIP images confirms the above findings CTA HEAD FINDINGS Anterior circulation: Calcified plaques are seen in the bilateral carotid siphons without hemodynamically significant stenosis. The bilateral MCA and ACA vascular trees have normal course and caliber. Posterior circulation: No significant stenosis, proximal occlusion, aneurysm, or vascular malformation. Venous sinuses: As permitted by contrast timing, patent. Review of the MIP images confirms the above findings IMPRESSION: 1. No evidence of hemodynamically significant stenosis in the head and neck. 2. Irregular ulcerated soft plaque in the aortic arch, distal to the origin of the left subclavian artery, without significant stenosis. 3. Mild stenosis of the origin of the left vertebral artery. 4. No large vessel occlusion in the intracranial or cervical portions of the anterior or posterior circulation. 5. Aortic atherosclerosis. Aortic Atherosclerosis (ICD10-I70.0). Electronically Signed   By: Baldemar Lenis  Knapp.D.   On: 10/19/2019 11:06   CT Head Wo Contrast  Result Date: 10/18/2019 CLINICAL DATA:  Headache and elevated blood pressure. EXAM: CT HEAD WITHOUT CONTRAST TECHNIQUE: Contiguous axial images were obtained from the base of the skull through the vertex without intravenous contrast. COMPARISON:  None. FINDINGS: Brain: There is mild cerebral atrophy with widening of the extra-axial spaces and ventricular dilatation. There are areas of decreased attenuation within the white matter tracts of the supratentorial brain, consistent with microvascular disease changes. Vascular: No hyperdense vessel or unexpected calcification. Skull: Normal. Negative for fracture or focal lesion. Sinuses/Orbits: No acute finding. Other: None. IMPRESSION: 1. Generalized cerebral atrophy. 2. No acute intracranial  abnormality. Electronically Signed   By: Aram Candela Knapp.D.   On: 10/18/2019 16:00   CT ANGIO NECK W OR WO CONTRAST  Result Date: 10/19/2019 CLINICAL DATA:  TIA. EXAM: CT ANGIOGRAPHY HEAD AND NECK TECHNIQUE: Multidetector CT imaging of the head and neck was performed using the standard protocol during bolus administration of intravenous contrast. Multiplanar CT image reconstructions and MIPs were obtained to evaluate the vascular anatomy. Carotid stenosis measurements (when applicable) are obtained utilizing NASCET criteria, using the distal internal carotid diameter as the denominator. CONTRAST:  48mL OMNIPAQUE IOHEXOL 350 MG/ML SOLN COMPARISON:  None. FINDINGS: CTA NECK FINDINGS Aortic arch: Standard branching. Imaged portion shows no evidence of aneurysm or dissection. Atherosclerotic changes of the aortic arch are noted with calcified plaques at the origin of the major neck arteries, without hemodynamically significant stenosis. Irregular ulcerated soft plaque is noted distal to the origin of the left subclavian artery without significant stenosis. Right carotid system: Increased tortuosity of the right common carotid artery and cervical segment of the right internal carotid artery. Atherosclerotic changes are noted in right carotid bifurcation with mixed density plaque without hemodynamically significant stenosis. Left carotid system: Increased tortuosity of the left common carotid artery and cervical segment of the left internal carotid artery. Atherosclerotic changes are noted in left carotid bifurcation with mixed density plaque without hemodynamically significant stenosis. Vertebral arteries: Calcified plaque is seen in the right subclavian artery, just proximal to the origin of the right vertebral artery, without hemodynamically significant stenosis. The left vertebral artery is slightly dominant. Mixed density plaque is noted in the left subclavian artery, proximal and extending to the origin of  the left vertebral artery resulting in mild stenosis. There is increased tortuosity of the V1 segment of the bilateral vertebral arteries, left greater than right. No evidence of dissection, high-grade stenosis or occlusion. Skeleton: Degenerative changes of the cervical spine are noted, more pronounced at C5-6 and C6-7. No aggressive bone lesion identified. Other neck: Mucous retention cyst in the left maxillary sinus. Upper chest: Negative Review of the MIP images confirms the above findings CTA HEAD FINDINGS Anterior circulation: Calcified plaques are seen in the bilateral carotid siphons without hemodynamically significant stenosis. The bilateral MCA and ACA vascular trees have normal course and caliber. Posterior circulation: No significant stenosis, proximal occlusion, aneurysm, or vascular malformation. Venous sinuses: As permitted by contrast timing, patent. Review of the MIP images confirms the above findings IMPRESSION: 1. No evidence of hemodynamically significant stenosis in the head and neck. 2. Irregular ulcerated soft plaque in the aortic arch, distal to the origin of the left subclavian artery, without significant stenosis. 3. Mild stenosis of the origin of the left vertebral artery. 4. No large vessel occlusion in the intracranial or cervical portions of the anterior or posterior circulation. 5. Aortic atherosclerosis. Aortic Atherosclerosis (ICD10-I70.0).  Electronically Signed   By: Baldemar Lenis Knapp.D.   On: 10/19/2019 11:06   MR BRAIN WO CONTRAST  Result Date: 10/18/2019 CLINICAL DATA:  Initial evaluation for acute headache, TIA. EXAM: MRI HEAD WITHOUT CONTRAST TECHNIQUE: Multiplanar, multiecho pulse sequences of the brain and surrounding structures were obtained without intravenous contrast. COMPARISON:  Prior head CT from earlier the same day. FINDINGS: Brain: Mild age-related cerebral atrophy. Patchy T2/FLAIR hyperintensity within the periventricular deep white matter both  cerebral hemispheres most consistent with chronic small vessel ischemic disease. Mild patchy involvement of the pons and cerebellum. No abnormal foci of restricted diffusion to suggest acute or subacute ischemia. Gray-white matter differentiation maintained. No encephalomalacia to suggest chronic cortical infarction. No evidence for acute intracranial hemorrhage. Small focus of susceptibility artifact within the central pons related to a parenchymal calcification as seen on prior CT. No other evidence for acute or chronic intracranial hemorrhage. No mass lesion, midline shift or mass effect. No hydrocephalus or extra-axial fluid collection. No made of a partially empty sella. Midline structures intact. Vascular: Major intracranial vascular flow voids are well maintained. Skull and upper cervical spine: Craniocervical junction within normal limits. Bone marrow signal intensity normal. No scalp soft tissue abnormality. Sinuses/Orbits: Globes and orbital soft tissues within normal limits. Left maxillary sinus retention cyst noted. Paranasal sinuses are otherwise clear. Trace left mastoid effusion noted, of doubtful significance. Inner ear structures grossly normal. Other: None. IMPRESSION: 1. No acute intracranial abnormality. 2. Mild age-related cerebral atrophy with chronic small vessel ischemic disease. Electronically Signed   By: Rise Mu Knapp.D.   On: 10/18/2019 19:36    Medications:   . [START ON 10/20/2019] aspirin EC  81 mg Oral Daily  . atorvastatin  80 mg Oral Daily  . clopidogrel  75 mg Oral Daily  . enoxaparin (LOVENOX) injection  40 mg Subcutaneous Q24H  . insulin aspart  0-5 Units Subcutaneous QHS  . insulin aspart  0-9 Units Subcutaneous TID WC  . losartan  25 mg Oral Daily   Continuous Infusions:   LOS: 0 days   Gwenyth Bender NP  [facility]   How to contact the [facility] Attending or Consulting provider 7A - 7P or covering provider during after hours 7P -7A, for this  patient?  1. Check the care team in [facility] and look for a) attending/consulting [facility] provider listed and b) the [facility] team listed 2. Log into [email or web address] and use [place]'s universal password to access. If you do not have the password, please contact the hospital operator. 3. Locate the [facility] provider you are looking for under [facility] and page to a number that you can be directly reached. 4. If you still have difficulty reaching the provider, please page the [facility] (Director on Call) for the Hospitalists listed on amion for assistance.  10/19/2019, 1:21 PM

## 2019-10-19 NOTE — Evaluation (Signed)
Physical Therapy Evaluation Patient Details Name: Thomas Knapp MRN: [id number] DOB: Sep 02, 1945 Today's Date: 10/19/2019   History of Present Illness  Patient isd a 74 y/o male who presents with HA, elevated BP and speech deficits. Head CT/brain MRI-unremarkale. Admitted with TIA vs HTN emergency. PMH includes HTN, DM, cholesterol.  Clinical Impression  Patient reports being independent and lives with wife PTA. Pt tolerated transfers, gait and stair training Mod I-independent without difficulties. Scored 23/24 on DGI indicating pt is not at increased risk for falls. Able to perform higher level balance challenges- walking backwards, changes in direction etc without difficulty. Reports no symptoms today and reports feeling at baseline. Education re: symptoms to look to indicate high blood pressure and importance of managing this. Pt does not require skilled therapy services as pt functioning at baseline. All education completed. Discharge from therapy.    Follow Up Recommendations No PT follow up    Equipment Recommendations  None recommended by PT    Recommendations for Other Services       Precautions / Restrictions Precautions Precautions: None Precaution Comments: watch BP Restrictions Weight Bearing Restrictions: No      Mobility  Bed Mobility Overal bed mobility: Independent             General bed mobility comments: Able to get into/out of bed no difficulties  Transfers Overall transfer level: Independent               General transfer comment: Stood from EOB without difficulties.  Ambulation/Gait Ambulation/Gait assistance: Modified independent (Device/Increase time) Gait Distance (Feet): 400 Feet Assistive device: None Gait Pattern/deviations: Step-through pattern;Decreased stride length   Gait velocity interpretation: 1.31 - 2.62 ft/sec, indicative of limited community ambulator General Gait Details: Slow, steady gait, Tolerated higher level balance  challenges without difficulty or LOB. See balance section.  Stairs Stairs: Yes Stairs assistance: Modified independent (Device/Increase time) Stair Management: One rail Right Number of Stairs: 3 General stair comments: Cues for safety  Wheelchair Mobility    Modified Rankin (Stroke Patients Only)       Balance Overall balance assessment: Needs assistance Sitting-balance support: Feet supported;No upper extremity supported Sitting balance-Leahy Scale: Good     Standing balance support: During functional activity Standing balance-Leahy Scale: Good               High level balance activites: Backward walking;Direction changes;Sudden stops;Head turns;Turns High Level Balance Comments: Tolerated above with no deviations in gait or LOb. Standardized Balance Assessment Standardized Balance Assessment : Dynamic Gait Index   Dynamic Gait Index Level Surface: Normal Change in Gait Speed: Normal Gait with Horizontal Head Turns: Normal Gait with Vertical Head Turns: Normal Gait and Pivot Turn: Normal Step Over Obstacle: Normal Step Around Obstacles: Normal Steps: Mild Impairment Total Score: 23       Pertinent Vitals/Pain Pain Assessment: No/denies pain    Home Living Family/patient expects to be discharged to:: Private residence Living Arrangements: Spouse/significant other Available Help at Discharge: Family;Available 24 hours/day Type of Home: House Home Access: Stairs to enter Entrance Stairs-Rails: None Entrance Stairs-Number of Steps: 3 Home Layout: One level Home Equipment: Shower seat      Prior Function Level of Independence: Independent         Comments: Does IADLs, ADLs, drives.     Hand Dominance   Dominant Hand: Right    Extremity/Trunk Assessment   Upper Extremity Assessment Upper Extremity Assessment: Defer to OT evaluation;Overall [facility] for tasks assessed    Lower Extremity Assessment  Lower Extremity Assessment: Overall WFL for  tasks assessed    Cervical / Trunk Assessment Cervical / Trunk Assessment: Normal  Communication   Communication: No difficulties  Cognition Arousal/Alertness: Awake/alert Behavior During Therapy: WFL for tasks assessed/performed Overall Cognitive Status: Within Functional Limits for tasks assessed                                 General Comments: "I know what happened," tells this PT he was spraying his yard with some sort of pesticide; and reports it got into his face and he did not wash it off right away. Explained what symptoms can happen with elevated BP.      General Comments      Exercises     Assessment/Plan    PT Assessment Patent does not need any further PT services  PT Problem List         PT Treatment Interventions      PT Goals (Current goals can be found in the Care Plan section)  Acute Rehab PT Goals Patient Stated Goal: to get back home PT Goal Formulation: All assessment and education complete, DC therapy    Frequency     Barriers to discharge        Co-evaluation               AM-PAC PT "6 Clicks" Mobility  Outcome Measure Help needed turning from your back to your side while in a flat bed without using bedrails?: None Help needed moving from lying on your back to sitting on the side of a flat bed without using bedrails?: None Help needed moving to and from a bed to a chair (including a wheelchair)?: None Help needed standing up from a chair using your arms (e.g., wheelchair or bedside chair)?: None Help needed to walk in hospital room?: None Help needed climbing 3-5 steps with a railing? : A Little 6 Click Score: 23    End of Session Equipment Utilized During Treatment: Gait belt Activity Tolerance: Patient tolerated treatment well Patient left: in bed;with call bell/phone within reach Nurse Communication: Mobility status PT Visit Diagnosis: Difficulty in walking, not elsewhere classified (R26.2)    Time:  8563-1497 PT Time Calculation (min) (ACUTE ONLY): 16 min   Charges:   PT Evaluation $PT Eval Moderate Complexity: 1 Mod          Vale Haven, PT, DPT Acute Rehabilitation Services Pager [phone] Office [phone]      Blake Divine A Lanier Ensign 10/19/2019, 10:57 AM

## 2019-10-19 NOTE — TOC CAGE-AID Note (Signed)
Transition of Care [facility]) - CAGE-AID Screening   Patient Details  Name: Thomas Knapp MRN: [id number] Date of Birth: Dec 20, 1945  Transition of [facility]) CM/SW Contact:    Jimmy Picket, [place] Phone Number: 10/19/2019, 12:12 PM   Clinical Narrative:  Pt denied alcohol use and substance use.   CAGE-AID Screening:    Have You Ever Felt You Ought to Cut Down on Your Drinking or Drug Use?: No Have People Annoyed You By Critizing Your Drinking Or Drug Use?: No Have You Felt Bad Or Guilty About Your Drinking Or Drug Use?: No Have You Ever Had a Drink or Used Drugs First Thing In The Morning to STeady Your Nerves or to Get Rid of a Hangover?: No CAGE-AID Score: 0  Substance Abuse Education Offered: No    Denita Lung, Bridget Hartshorn Clinical Social Worker [phone]

## 2019-10-19 NOTE — Progress Notes (Signed)
  Echocardiogram 2D Echocardiogram has been performed.  Brooks Kinnan G Korene Dula 10/19/2019, 1:51 PM

## 2019-10-19 NOTE — Progress Notes (Signed)
RN gave pt discharge instructions and the patient stated understanding, 2 new medications escribed to pt home pharmacy. IV has been removed and pt is getting dressed.

## 2019-10-19 NOTE — Progress Notes (Addendum)
STROKE TEAM PROGRESS NOTE   INTERVAL HISTORY His wife is at the bedside.  I personally obtained history of presenting illness, reviewed electronic medical records and imaging films in PACS.  Patient states he started with a headache yesterday and then subsequently had about a 6-hour.  Of expressive speech difficulties.  These have improved but headache is present but not as bad.  He denies any prior history of migraine headaches TIAs or strokes.  MRI scan of the brain shows no acute infarct.  LDL cholesterol 70 mg percent and hemoglobin A1c is 7.0.  Vitals:   10/18/19 2105 10/18/19 2137 10/19/19 0008 10/19/19 0440  BP: (!) 200/92 (!) 182/80 (!) 171/85 (!) 151/88  Pulse:  62 64 65  Resp:  16 16 16   Temp: 98.3 F (36.8 C) 98.1 F (36.7 C) 97.7 F (36.5 C) 97.8 F (36.6 C)  TempSrc: Oral Oral Oral Oral  SpO2:  99% 96% 96%  Weight:      Height:        CBC:  Recent Labs  Lab 10/18/19 1348 10/19/19 0442  WBC 13.9* 10.4  NEUTROABS 11.8*  --   HGB 15.4 14.3  HCT 46.0 42.7  MCV 89.0 87.7  PLT 186 176    Basic Metabolic Panel:  Recent Labs  Lab 10/18/19 1348  NA 135  K 4.3  CL 99  CO2 23  GLUCOSE 203*  BUN 15  CREATININE 0.91  CALCIUM 9.5   Lipid Panel:     Component Value Date/Time   CHOL 135 10/19/2019 0442   TRIG 144 10/19/2019 0442   HDL 36 (L) 10/19/2019 0442   CHOLHDL 3.8 10/19/2019 0442   VLDL 29 10/19/2019 0442   LDLCALC 70 10/19/2019 0442   HgbA1c:  Lab Results  Component Value Date   HGBA1C 7.0 (H) 10/19/2019   Urine Drug Screen: No results found for: LABOPIA, COCAINSCRNUR, LABBENZ, AMPHETMU, THCU, LABBARB  Alcohol Level No results found for: ETH  IMAGING past 24 hours CT ANGIO HEAD W OR WO CONTRAST  Result Date: 10/19/2019 CLINICAL DATA:  TIA. EXAM: CT ANGIOGRAPHY HEAD AND NECK TECHNIQUE: Multidetector CT imaging of the head and neck was performed using the standard protocol during bolus administration of intravenous contrast. Multiplanar CT image  reconstructions and MIPs were obtained to evaluate the vascular anatomy. Carotid stenosis measurements (when applicable) are obtained utilizing NASCET criteria, using the distal internal carotid diameter as the denominator. CONTRAST:  50mL OMNIPAQUE IOHEXOL 350 MG/ML SOLN COMPARISON:  None. FINDINGS: CTA NECK FINDINGS Aortic arch: Standard branching. Imaged portion shows no evidence of aneurysm or dissection. Atherosclerotic changes of the aortic arch are noted with calcified plaques at the origin of the major neck arteries, without hemodynamically significant stenosis. Irregular ulcerated soft plaque is noted distal to the origin of the left subclavian artery without significant stenosis. Right carotid system: Increased tortuosity of the right common carotid artery and cervical segment of the right internal carotid artery. Atherosclerotic changes are noted in right carotid bifurcation with mixed density plaque without hemodynamically significant stenosis. Left carotid system: Increased tortuosity of the left common carotid artery and cervical segment of the left internal carotid artery. Atherosclerotic changes are noted in left carotid bifurcation with mixed density plaque without hemodynamically significant stenosis. Vertebral arteries: Calcified plaque is seen in the right subclavian artery, just proximal to the origin of the right vertebral artery, without hemodynamically significant stenosis. The left vertebral artery is slightly dominant. Mixed density plaque is noted in the left subclavian artery, proximal  and extending to the origin of the left vertebral artery resulting in mild stenosis. There is increased tortuosity of the V1 segment of the bilateral vertebral arteries, left greater than right. No evidence of dissection, high-grade stenosis or occlusion. Skeleton: Degenerative changes of the cervical spine are noted, more pronounced at C5-6 and C6-7. No aggressive bone lesion identified. Other neck: Mucous  retention cyst in the left maxillary sinus. Upper chest: Negative Review of the MIP images confirms the above findings CTA HEAD FINDINGS Anterior circulation: Calcified plaques are seen in the bilateral carotid siphons without hemodynamically significant stenosis. The bilateral MCA and ACA vascular trees have normal course and caliber. Posterior circulation: No significant stenosis, proximal occlusion, aneurysm, or vascular malformation. Venous sinuses: As permitted by contrast timing, patent. Review of the MIP images confirms the above findings IMPRESSION: 1. No evidence of hemodynamically significant stenosis in the head and neck. 2. Irregular ulcerated soft plaque in the aortic arch, distal to the origin of the left subclavian artery, without significant stenosis. 3. Mild stenosis of the origin of the left vertebral artery. 4. No large vessel occlusion in the intracranial or cervical portions of the anterior or posterior circulation. 5. Aortic atherosclerosis. Aortic Atherosclerosis (ICD10-I70.0). Electronically Signed   By: Pedro Earls M.D.   On: 10/19/2019 11:06   CT Head Wo Contrast  Result Date: 10/18/2019 CLINICAL DATA:  Headache and elevated blood pressure. EXAM: CT HEAD WITHOUT CONTRAST TECHNIQUE: Contiguous axial images were obtained from the base of the skull through the vertex without intravenous contrast. COMPARISON:  None. FINDINGS: Brain: There is mild cerebral atrophy with widening of the extra-axial spaces and ventricular dilatation. There are areas of decreased attenuation within the white matter tracts of the supratentorial brain, consistent with microvascular disease changes. Vascular: No hyperdense vessel or unexpected calcification. Skull: Normal. Negative for fracture or focal lesion. Sinuses/Orbits: No acute finding. Other: None. IMPRESSION: 1. Generalized cerebral atrophy. 2. No acute intracranial abnormality. Electronically Signed   By: Virgina Norfolk M.D.   On:  10/18/2019 16:00   CT ANGIO NECK W OR WO CONTRAST  Result Date: 10/19/2019 CLINICAL DATA:  TIA. EXAM: CT ANGIOGRAPHY HEAD AND NECK TECHNIQUE: Multidetector CT imaging of the head and neck was performed using the standard protocol during bolus administration of intravenous contrast. Multiplanar CT image reconstructions and MIPs were obtained to evaluate the vascular anatomy. Carotid stenosis measurements (when applicable) are obtained utilizing NASCET criteria, using the distal internal carotid diameter as the denominator. CONTRAST:  85mL OMNIPAQUE IOHEXOL 350 MG/ML SOLN COMPARISON:  None. FINDINGS: CTA NECK FINDINGS Aortic arch: Standard branching. Imaged portion shows no evidence of aneurysm or dissection. Atherosclerotic changes of the aortic arch are noted with calcified plaques at the origin of the major neck arteries, without hemodynamically significant stenosis. Irregular ulcerated soft plaque is noted distal to the origin of the left subclavian artery without significant stenosis. Right carotid system: Increased tortuosity of the right common carotid artery and cervical segment of the right internal carotid artery. Atherosclerotic changes are noted in right carotid bifurcation with mixed density plaque without hemodynamically significant stenosis. Left carotid system: Increased tortuosity of the left common carotid artery and cervical segment of the left internal carotid artery. Atherosclerotic changes are noted in left carotid bifurcation with mixed density plaque without hemodynamically significant stenosis. Vertebral arteries: Calcified plaque is seen in the right subclavian artery, just proximal to the origin of the right vertebral artery, without hemodynamically significant stenosis. The left vertebral artery is slightly dominant. Mixed  density plaque is noted in the left subclavian artery, proximal and extending to the origin of the left vertebral artery resulting in mild stenosis. There is  increased tortuosity of the V1 segment of the bilateral vertebral arteries, left greater than right. No evidence of dissection, high-grade stenosis or occlusion. Skeleton: Degenerative changes of the cervical spine are noted, more pronounced at C5-6 and C6-7. No aggressive bone lesion identified. Other neck: Mucous retention cyst in the left maxillary sinus. Upper chest: Negative Review of the MIP images confirms the above findings CTA HEAD FINDINGS Anterior circulation: Calcified plaques are seen in the bilateral carotid siphons without hemodynamically significant stenosis. The bilateral MCA and ACA vascular trees have normal course and caliber. Posterior circulation: No significant stenosis, proximal occlusion, aneurysm, or vascular malformation. Venous sinuses: As permitted by contrast timing, patent. Review of the MIP images confirms the above findings IMPRESSION: 1. No evidence of hemodynamically significant stenosis in the head and neck. 2. Irregular ulcerated soft plaque in the aortic arch, distal to the origin of the left subclavian artery, without significant stenosis. 3. Mild stenosis of the origin of the left vertebral artery. 4. No large vessel occlusion in the intracranial or cervical portions of the anterior or posterior circulation. 5. Aortic atherosclerosis. Aortic Atherosclerosis (ICD10-I70.0). Electronically Signed   By: Baldemar Lenis M.D.   On: 10/19/2019 11:06   MR BRAIN WO CONTRAST  Result Date: 10/18/2019 CLINICAL DATA:  Initial evaluation for acute headache, TIA. EXAM: MRI HEAD WITHOUT CONTRAST TECHNIQUE: Multiplanar, multiecho pulse sequences of the brain and surrounding structures were obtained without intravenous contrast. COMPARISON:  Prior head CT from earlier the same day. FINDINGS: Brain: Mild age-related cerebral atrophy. Patchy T2/FLAIR hyperintensity within the periventricular deep white matter both cerebral hemispheres most consistent with chronic small vessel  ischemic disease. Mild patchy involvement of the pons and cerebellum. No abnormal foci of restricted diffusion to suggest acute or subacute ischemia. Gray-white matter differentiation maintained. No encephalomalacia to suggest chronic cortical infarction. No evidence for acute intracranial hemorrhage. Small focus of susceptibility artifact within the central pons related to a parenchymal calcification as seen on prior CT. No other evidence for acute or chronic intracranial hemorrhage. No mass lesion, midline shift or mass effect. No hydrocephalus or extra-axial fluid collection. No made of a partially empty sella. Midline structures intact. Vascular: Major intracranial vascular flow voids are well maintained. Skull and upper cervical spine: Craniocervical junction within normal limits. Bone marrow signal intensity normal. No scalp soft tissue abnormality. Sinuses/Orbits: Globes and orbital soft tissues within normal limits. Left maxillary sinus retention cyst noted. Paranasal sinuses are otherwise clear. Trace left mastoid effusion noted, of doubtful significance. Inner ear structures grossly normal. Other: None. IMPRESSION: 1. No acute intracranial abnormality. 2. Mild age-related cerebral atrophy with chronic small vessel ischemic disease. Electronically Signed   By: Rise Mu M.D.   On: 10/18/2019 19:36    PHYSICAL EXAM Pleasant elderly Caucasian male not in distress. . Afebrile. Head is nontraumatic. Neck is supple without bruit.    Cardiac exam no murmur or gallop. Lungs are clear to auscultation. Distal pulses are well felt. Neurological Exam ;  Awake  Alert oriented x 3. Normal speech and language.eye movements full without nystagmus.fundi were not visualized. Vision acuity and fields appear normal. Hearing is normal. Palatal movements are normal. Face symmetric. Tongue midline. Normal strength, tone, reflexes and coordination. Normal sensation. Gait deferred.  ASSESSMENT/PLAN [name] is a 74 y.o. male with of DB, HTN,  HLD presenting with severe HA, HTN 190/90 and expressive aphasia.   Possible TIA versus first episode of complicated migraine  CT head No acute abnormality. Small vessel disease. Atrophy.   MRI  No acute abnormality. Small vessel disease. Atrophy.    CTA head & neck No acute abnormality. Aortic arch with ulcerated soft plaque. L VA origin stenosis. Aortic atherosclerosis.   2D Echo EF 55-60%. No source of embolus. LA mildlu dilated. Aortic dilatation.   LDL 70  HgbA1c 7.0  Lovenox 40 mg sq daily for VTE prophylaxis  No antithrombotic prior to admission, now on aspirin 325 mg daily. Reduce aspirin to 81 and add plavix 75 x 3 weeks then plavix alone  Therapy recommendations:  No therapy needs  Disposition:  Return home w/ wife Ok for d/c from stroke standpoint Follow-up Stroke Clinic at [facility] in 4 weeks. Office will call with appointment date and time. Order placed.  Hypertensive Urgency  BP as high as 230/107  Home meds:  Non-compliant for yrs    Improved to 150s in hospital . BP goal normotensive  Hyperlipidemia  Home meds:  lipitor 10  Now on lipitor 80  LDL 70  Continue statin at discharge  Diabetes type II Controlled  HgbA1c 7.0  Other Stroke Risk Factors  Advanced age  Hx ETOH use  Obesity, Body mass index is 34.22 kg/m., recommend weight loss, diet and exercise as appropriate   Other Active Problems  Mild leukocytosis, resolved   Hospital day # 0  Patient had a transient episode of speech difficulties in the setting of an ongoing headache for 2 days unclear whether this represents initial episode of complicated migraine versus left hemispheric TIA.  Recommend continue ongoing stroke work-up and aggressive risk factor modification.  Aspirin and Plavix for 3 weeks followed by Plavix alone.  Long discussion with the patient wife and Dr. Benjamine Mola and answered questions.  Greater than 50%  time during this 25-minute visit was spent in counseling and coordination of care and discussion with care team.  Follow-up as an outpatient stroke clinic in 6 weeks.  Stroke team will sign off.  Kindly call for questions. Delia Heady, MD To contact Stroke Continuity provider, please refer to [email or web address]. After hours, contact General Neurology

## 2019-10-19 NOTE — Discharge Instructions (Signed)
·   aspirin  81 and plavix 75 x 3 weeks then PLAVIX ALONE

## 2019-10-19 NOTE — Progress Notes (Signed)
Occupational Therapy Evaluation Patient Details Name: Thomas Knapp MRN: [id number] DOB: Jan 12, 1946 Today's Date: 10/19/2019    History of Present Illness Patient isd a 74 y/o male who presents with HA, elevated BP and speech deficits. Head CT/brain MRI-unremarkale. Admitted with TIA vs HTN emergency. PMH includes HTN, DM, cholesterol.   Clinical Impression   Patient lives at home with wife and is independent at baseline.  Patient reporting no noticeable changes at this time.  UE strength was [facility] and symmetrical.  Vision and coordination good.  Patient demonstrated good balance and independencen with functional mobility and ADLs.  BP was elevated during session, 183/92 at start and 203/104 at end.  Patient does not need further OT and will be good to return home without therapy.    Follow Up Recommendations  No OT follow up    Equipment Recommendations  None recommended by OT    Recommendations for Other Services       Precautions / Restrictions Precautions Precautions: None Precaution Comments: watch BP Restrictions Weight Bearing Restrictions: No      Mobility Bed Mobility Overal bed mobility: Independent             General bed mobility comments: Able to get into/out of bed no difficulties  Transfers Overall transfer level: Independent Equipment used: None             General transfer comment: Stood from EOB without difficulties.    Balance Overall balance assessment: No apparent balance deficits (not formally assessed) Sitting-balance support: Feet supported;No upper extremity supported Sitting balance-Leahy Scale: Good     Standing balance support: During functional activity Standing balance-Leahy Scale: Good               High level balance activites: Backward walking;Direction changes;Sudden stops;Head turns;Turns High Level Balance Comments: Tolerated above with no deviations in gait or LOb. Standardized Balance Assessment Standardized  Balance Assessment : Dynamic Gait Index   Dynamic Gait Index Level Surface: Normal Change in Gait Speed: Normal Gait with Horizontal Head Turns: Normal Gait with Vertical Head Turns: Normal Gait and Pivot Turn: Normal Step Over Obstacle: Normal Step Around Obstacles: Normal Steps: Mild Impairment Total Score: 23     ADL either performed or assessed with clinical judgement   ADL Overall ADL's : Independent                                             Vision Baseline Vision/History: No visual deficits(chronic R eye esotopia)       Perception     Praxis      Pertinent Vitals/Pain Pain Assessment: No/denies pain     Hand Dominance Right   Extremity/Trunk Assessment Upper Extremity Assessment Upper Extremity Assessment: Overall WFL for tasks assessed(symmetrical)   Lower Extremity Assessment Lower Extremity Assessment: Overall WFL for tasks assessed   Cervical / Trunk Assessment Cervical / Trunk Assessment: Normal   Communication Communication Communication: No difficulties   Cognition Arousal/Alertness: Awake/alert Behavior During Therapy: WFL for tasks assessed/performed Overall Cognitive Status: Within Functional Limits for tasks assessed                                 General Comments: "I know what happened," tells this PT he was spraying his yard with some sort of pesticide; and reports it got into  his face and he did not wash it off right away. Explained what symptoms can happen with elevated BP.   General Comments  BP elevated - 183/92 at start of session, 208/104 at end of session    Exercises     Shoulder Instructions      Home Living Family/patient expects to be discharged to:: Private residence Living Arrangements: Spouse/significant other Available Help at Discharge: Family;Available 24 hours/day Type of Home: House Home Access: Stairs to enter [employer] of Steps: 3 Entrance Stairs-Rails:  None Home Layout: One level     Bathroom Shower/Tub: Walk-in shower;Tub only   Bathroom Toilet: Handicapped height     Home Equipment: Shower seat          Prior Functioning/Environment Level of Independence: Independent        Comments: Does IADLs, ADLs, drives.        OT Problem List:        OT Treatment/Interventions:      OT Goals(Current goals can be found in the care plan section) Acute Rehab OT Goals Patient Stated Goal: to get back home OT Goal Formulation: With patient Time For Goal Achievement: 11/02/19 Potential to Achieve Goals: Good  OT Frequency:     Barriers to D/C:            Co-evaluation              AM-PAC OT "6 Clicks" Daily Activity     Outcome Measure Help from another person eating meals?: None Help from another person taking care of personal grooming?: None Help from another person toileting, which includes using toliet, bedpan, or urinal?: None Help from another person bathing (including washing, rinsing, drying)?: None Help from another person to put on and taking off regular upper body clothing?: None Help from another person to put on and taking off regular lower body clothing?: None 6 Click Score: 24   End of Session Equipment Utilized During Treatment: Gait belt Nurse Communication: Mobility status  Activity Tolerance: Patient tolerated treatment well Patient left: in bed;with call bell/phone within reach  OT Visit Diagnosis: Other symptoms and signs involving the nervous system (R29.898)                Time: 1601-0932 OT Time Calculation (min): 23 min Charges:  OT General Charges $OT Visit: 1 Visit OT Evaluation $OT Eval Moderate Complexity: 1 Mod OT Treatments $Self Care/Home Management : 8-22 mins  August Luz, OTR/L   Phylliss Bob 10/19/2019, 1:47 PM

## 2019-10-19 NOTE — Discharge Summary (Signed)
Physician Discharge Summary  Thomas MangesWilliam M Dalziel ZOX:[id number] DOB: 09/11/1945 DOA: 10/18/2019  PCP: Tracey HarriesBouska, David, MD  Admit date: 10/18/2019 Discharge date: 10/19/2019  Admitted From: Home Discharge disposition: Home   Recommendations for Outpatient Follow-Up:   1. Needs continued management of uncontrolled blood pressure 2. Patient to take aspirin plus Plavix for 3 weeks then Plavix alone 3. Outpatient neurology follow-up   Discharge Diagnosis:   Principal Problem:   TIA (transient ischemic attack) Active Problems:   Hypertensive emergency   Leukocytosis   Hyperlipidemia   Diabetes (HCC)    Discharge Condition: Improved.  Diet recommendation: Low sodium, heart healthy.  Carbohydrate-modified  Wound care: None.  Code status: Full.   History of Present Illness:   Thomas Knapp is a 74 y.o. male with medical history significant of diabetes, hypertension, hyperlipidemia presenting with complaints of headache and expressive aphasia.  Patient states his blood pressure has been running very high at home.  Yesterday it was 165/ 102.  Yesterday around noon when he tried to speak he was having difficulty getting words out.  Wife at bedside states she is not sure about facial droop but noticed a slight shadow or crack on the patient's right cheek at that time.  No focal weakness or numbness.  He continued to have difficulty with speech all day.  At night around bedtime he started having a severe headache.  This morning when he woke up his speech has gone back to normal but the headache persisted and he felt nauseous.  He is still having a slight headache at this time.  Denies any fevers, chills, cough, shortness of breath, chest pain, vomiting, abdominal pain, diarrhea, or diarrhea.  States he takes aspirin on occasion for headaches.  He takes Lipitor at home for high cholesterol but is not sure about the dose.  Unclear what he takes for hypertension.  For his diabetes, he takes  Metformin and another oral medication, not sure about the name.  He is not on insulin.  Wife states patient's blood glucose has been running in the 200s.   Hospital Course by Problem:     TIA.  CT of the head reveals no acute abnormality.  MRI of the brain no acute intracranial abnormality.  Risk factors include diabetes, hypertension, hypercholesterolemia.  -CTA head/neck done Echo: EF 55-60%. Diastolic dsfn. No source of embolus. LA mildly dilated. Aortic dilatation -Aspirin and Plavix for 3 weeks followed by Plavix alone LDL/HgbA1c at goal   Hypertension/hypertensive urgency.  Patient with a long history of noncompliance with his antihypertensive medications.  Systolic pressure 230 upon presentation.  He states that hypertensive meds in the past have "given me diarrhea or made me sick to my stomach or something".  He received 20 mg of labetalol in the emergency department.  Chart review indicates he was on losartan in the past.  Neurology recommended permissive hypertension till Monday morning -We will need close outpatient follow-up and monitoring for compliance   Leukocytosis. Mild. Resolved this am. No fever   Diabetes type 2.  Non-insulin-dependent.  Hemoglobin A1c 7.0.   -Resume home meds with close outpatient follow-up  Hyperlipidemia.   -LDL: 70 -continue statin    Medical Consultants:   neurology  Discharge Exam:   Vitals:   10/19/19 0440 10/19/19 1227  BP: (!) 151/88 (!) 151/84  Pulse: 65 63  Resp: 16 16  Temp: 97.8 F (36.6 C) 97.7 F (36.5 C)  SpO2: 96% 98%   Vitals:  10/18/19 2137 10/19/19 0008 10/19/19 0440 10/19/19 1227  BP: (!) 182/80 (!) 171/85 (!) 151/88 (!) 151/84  Pulse: 62 64 65 63  Resp: 16 16 16 16   Temp: 98.1 F (36.7 C) 97.7 F (36.5 C) 97.8 F (36.6 C) 97.7 F (36.5 C)  TempSrc: Oral Oral Oral Oral  SpO2: 99% 96% 96% 98%  Weight:      Height:        General exam: Appears calm and comfortable.    The results of significant  diagnostics from this hospitalization (including imaging, microbiology, ancillary and laboratory) are listed below for reference.     Procedures and Diagnostic Studies:   CT ANGIO HEAD W OR WO CONTRAST  Result Date: 10/19/2019 CLINICAL DATA:  TIA. EXAM: CT ANGIOGRAPHY HEAD AND NECK TECHNIQUE: Multidetector CT imaging of the head and neck was performed using the standard protocol during bolus administration of intravenous contrast. Multiplanar CT image reconstructions and MIPs were obtained to evaluate the vascular anatomy. Carotid stenosis measurements (when applicable) are obtained utilizing NASCET criteria, using the distal internal carotid diameter as the denominator. CONTRAST:  71mL OMNIPAQUE IOHEXOL 350 MG/ML SOLN COMPARISON:  None. FINDINGS: CTA NECK FINDINGS Aortic arch: Standard branching. Imaged portion shows no evidence of aneurysm or dissection. Atherosclerotic changes of the aortic arch are noted with calcified plaques at the origin of the major neck arteries, without hemodynamically significant stenosis. Irregular ulcerated soft plaque is noted distal to the origin of the left subclavian artery without significant stenosis. Right carotid system: Increased tortuosity of the right common carotid artery and cervical segment of the right internal carotid artery. Atherosclerotic changes are noted in right carotid bifurcation with mixed density plaque without hemodynamically significant stenosis. Left carotid system: Increased tortuosity of the left common carotid artery and cervical segment of the left internal carotid artery. Atherosclerotic changes are noted in left carotid bifurcation with mixed density plaque without hemodynamically significant stenosis. Vertebral arteries: Calcified plaque is seen in the right subclavian artery, just proximal to the origin of the right vertebral artery, without hemodynamically significant stenosis. The left vertebral artery is slightly dominant. Mixed density  plaque is noted in the left subclavian artery, proximal and extending to the origin of the left vertebral artery resulting in mild stenosis. There is increased tortuosity of the V1 segment of the bilateral vertebral arteries, left greater than right. No evidence of dissection, high-grade stenosis or occlusion. Skeleton: Degenerative changes of the cervical spine are noted, more pronounced at C5-6 and C6-7. No aggressive bone lesion identified. Other neck: Mucous retention cyst in the left maxillary sinus. Upper chest: Negative Review of the MIP images confirms the above findings CTA HEAD FINDINGS Anterior circulation: Calcified plaques are seen in the bilateral carotid siphons without hemodynamically significant stenosis. The bilateral MCA and ACA vascular trees have normal course and caliber. Posterior circulation: No significant stenosis, proximal occlusion, aneurysm, or vascular malformation. Venous sinuses: As permitted by contrast timing, patent. Review of the MIP images confirms the above findings IMPRESSION: 1. No evidence of hemodynamically significant stenosis in the head and neck. 2. Irregular ulcerated soft plaque in the aortic arch, distal to the origin of the left subclavian artery, without significant stenosis. 3. Mild stenosis of the origin of the left vertebral artery. 4. No large vessel occlusion in the intracranial or cervical portions of the anterior or posterior circulation. 5. Aortic atherosclerosis. Aortic Atherosclerosis (ICD10-I70.0). Electronically Signed   By: 74m M.D.   On: 10/19/2019 11:06   CT Head  Wo Contrast  Result Date: 10/18/2019 CLINICAL DATA:  Headache and elevated blood pressure. EXAM: CT HEAD WITHOUT CONTRAST TECHNIQUE: Contiguous axial images were obtained from the base of the skull through the vertex without intravenous contrast. COMPARISON:  None. FINDINGS: Brain: There is mild cerebral atrophy with widening of the extra-axial spaces and  ventricular dilatation. There are areas of decreased attenuation within the white matter tracts of the supratentorial brain, consistent with microvascular disease changes. Vascular: No hyperdense vessel or unexpected calcification. Skull: Normal. Negative for fracture or focal lesion. Sinuses/Orbits: No acute finding. Other: None. IMPRESSION: 1. Generalized cerebral atrophy. 2. No acute intracranial abnormality. Electronically Signed   By: Aram Candela M.D.   On: 10/18/2019 16:00   CT ANGIO NECK W OR WO CONTRAST  Result Date: 10/19/2019 CLINICAL DATA:  TIA. EXAM: CT ANGIOGRAPHY HEAD AND NECK TECHNIQUE: Multidetector CT imaging of the head and neck was performed using the standard protocol during bolus administration of intravenous contrast. Multiplanar CT image reconstructions and MIPs were obtained to evaluate the vascular anatomy. Carotid stenosis measurements (when applicable) are obtained utilizing NASCET criteria, using the distal internal carotid diameter as the denominator. CONTRAST:  75mL OMNIPAQUE IOHEXOL 350 MG/ML SOLN COMPARISON:  None. FINDINGS: CTA NECK FINDINGS Aortic arch: Standard branching. Imaged portion shows no evidence of aneurysm or dissection. Atherosclerotic changes of the aortic arch are noted with calcified plaques at the origin of the major neck arteries, without hemodynamically significant stenosis. Irregular ulcerated soft plaque is noted distal to the origin of the left subclavian artery without significant stenosis. Right carotid system: Increased tortuosity of the right common carotid artery and cervical segment of the right internal carotid artery. Atherosclerotic changes are noted in right carotid bifurcation with mixed density plaque without hemodynamically significant stenosis. Left carotid system: Increased tortuosity of the left common carotid artery and cervical segment of the left internal carotid artery. Atherosclerotic changes are noted in left carotid bifurcation  with mixed density plaque without hemodynamically significant stenosis. Vertebral arteries: Calcified plaque is seen in the right subclavian artery, just proximal to the origin of the right vertebral artery, without hemodynamically significant stenosis. The left vertebral artery is slightly dominant. Mixed density plaque is noted in the left subclavian artery, proximal and extending to the origin of the left vertebral artery resulting in mild stenosis. There is increased tortuosity of the V1 segment of the bilateral vertebral arteries, left greater than right. No evidence of dissection, high-grade stenosis or occlusion. Skeleton: Degenerative changes of the cervical spine are noted, more pronounced at C5-6 and C6-7. No aggressive bone lesion identified. Other neck: Mucous retention cyst in the left maxillary sinus. Upper chest: Negative Review of the MIP images confirms the above findings CTA HEAD FINDINGS Anterior circulation: Calcified plaques are seen in the bilateral carotid siphons without hemodynamically significant stenosis. The bilateral MCA and ACA vascular trees have normal course and caliber. Posterior circulation: No significant stenosis, proximal occlusion, aneurysm, or vascular malformation. Venous sinuses: As permitted by contrast timing, patent. Review of the MIP images confirms the above findings IMPRESSION: 1. No evidence of hemodynamically significant stenosis in the head and neck. 2. Irregular ulcerated soft plaque in the aortic arch, distal to the origin of the left subclavian artery, without significant stenosis. 3. Mild stenosis of the origin of the left vertebral artery. 4. No large vessel occlusion in the intracranial or cervical portions of the anterior or posterior circulation. 5. Aortic atherosclerosis. Aortic Atherosclerosis (ICD10-I70.0). Electronically Signed   By: Baldemar Lenis  M.D.   On: 10/19/2019 11:06   MR BRAIN WO CONTRAST  Result Date: 10/18/2019 CLINICAL  DATA:  Initial evaluation for acute headache, TIA. EXAM: MRI HEAD WITHOUT CONTRAST TECHNIQUE: Multiplanar, multiecho pulse sequences of the brain and surrounding structures were obtained without intravenous contrast. COMPARISON:  Prior head CT from earlier the same day. FINDINGS: Brain: Mild age-related cerebral atrophy. Patchy T2/FLAIR hyperintensity within the periventricular deep white matter both cerebral hemispheres most consistent with chronic small vessel ischemic disease. Mild patchy involvement of the pons and cerebellum. No abnormal foci of restricted diffusion to suggest acute or subacute ischemia. Gray-white matter differentiation maintained. No encephalomalacia to suggest chronic cortical infarction. No evidence for acute intracranial hemorrhage. Small focus of susceptibility artifact within the central pons related to a parenchymal calcification as seen on prior CT. No other evidence for acute or chronic intracranial hemorrhage. No mass lesion, midline shift or mass effect. No hydrocephalus or extra-axial fluid collection. No made of a partially empty sella. Midline structures intact. Vascular: Major intracranial vascular flow voids are well maintained. Skull and upper cervical spine: Craniocervical junction within normal limits. Bone marrow signal intensity normal. No scalp soft tissue abnormality. Sinuses/Orbits: Globes and orbital soft tissues within normal limits. Left maxillary sinus retention cyst noted. Paranasal sinuses are otherwise clear. Trace left mastoid effusion noted, of doubtful significance. Inner ear structures grossly normal. Other: None. IMPRESSION: 1. No acute intracranial abnormality. 2. Mild age-related cerebral atrophy with chronic small vessel ischemic disease. Electronically Signed   By: Rise Mu M.D.   On: 10/18/2019 19:36   ECHOCARDIOGRAM COMPLETE  Result Date: 10/19/2019    ECHOCARDIOGRAM REPORT   Patient Name:   Thomas Knapp Date of Exam: 10/19/2019  Medical Rec #:  [id number]      Height:       66.0 in Accession #:    [id number]     Weight:       212.0 lb Date of Birth:  08-12-1945       BSA:          2.050 m Patient Age:    74 years       BP:           151/88 mmHg Patient Gender: M              HR:           57 bpm. Exam Location:  Inpatient Procedure: 2D Echo, Cardiac Doppler and Color Doppler Indications:    TIA 435.9 / G45.9  History:        Patient has no prior history of Echocardiogram examinations.                 Risk Factors:Hypertension, Diabetes and Dyslipidemia.  Sonographer:    Elmarie Shiley Dance Referring Phys: [id number] VASUNDHRA RATHORE IMPRESSIONS  1. Left ventricular ejection fraction, by estimation, is 55 to 60%. The left ventricle has normal function. The left ventricle has no regional wall motion abnormalities. Left ventricular diastolic parameters are consistent with Grade II diastolic dysfunction (pseudonormalization).  2. Right ventricular systolic function is normal. The right ventricular size is normal. There is normal pulmonary artery systolic pressure.  3. Left atrial size was mildly dilated.  4. The mitral valve is normal in structure. No evidence of mitral valve regurgitation. No evidence of mitral stenosis.  5. The aortic valve is normal in structure. Aortic valve regurgitation is not visualized. No aortic stenosis is present.  6. Aortic dilatation noted. There is mild dilatation of the  ascending aorta measuring 39 mm. FINDINGS  Left Ventricle: Left ventricular ejection fraction, by estimation, is 55 to 60%. The left ventricle has normal function. The left ventricle has no regional wall motion abnormalities. The left ventricular internal cavity size was normal in size. There is  no left ventricular hypertrophy. Left ventricular diastolic parameters are consistent with Grade II diastolic dysfunction (pseudonormalization). Right Ventricle: The right ventricular size is normal. No increase in right ventricular wall thickness. Right  ventricular systolic function is normal. There is normal pulmonary artery systolic pressure. The tricuspid regurgitant velocity is 2.14 m/s, and  with an assumed right atrial pressure of 3 mmHg, the estimated right ventricular systolic pressure is 21.3 mmHg. Left Atrium: Left atrial size was mildly dilated. Right Atrium: Right atrial size was normal in size. Pericardium: There is no evidence of pericardial effusion. Mitral Valve: The mitral valve is normal in structure. No evidence of mitral valve regurgitation. No evidence of mitral valve stenosis. Tricuspid Valve: The tricuspid valve is grossly normal. Tricuspid valve regurgitation is not demonstrated. No evidence of tricuspid stenosis. Aortic Valve: The aortic valve is normal in structure. Aortic valve regurgitation is not visualized. No aortic stenosis is present. Pulmonic Valve: The pulmonic valve was normal in structure. Pulmonic valve regurgitation is not visualized. No evidence of pulmonic stenosis. Aorta: Aortic dilatation noted. There is mild dilatation of the ascending aorta measuring 39 mm. IAS/Shunts: The atrial septum is grossly normal.  LEFT VENTRICLE PLAX 2D LVIDd:         4.49 cm  Diastology LVIDs:         3.70 cm  LV e' lateral:   7.38 cm/s LV PW:         1.23 cm  LV E/e' lateral: 7.8 LV IVS:        1.01 cm  LV e' medial:    5.68 cm/s LVOT diam:     1.80 cm  LV E/e' medial:  10.1 LV SV:         60 LV SV Index:   29 LVOT Area:     2.54 cm  RIGHT VENTRICLE             IVC RV Basal diam:  2.52 cm     IVC diam: 1.83 cm RV S prime:     14.70 cm/s TAPSE (M-mode): 1.8 cm LEFT ATRIUM             Index       RIGHT ATRIUM           Index LA diam:        3.60 cm 1.76 cm/m  RA Area:     13.90 cm LA Vol (A2C):   58.9 ml 28.73 ml/m RA Volume:   32.10 ml  15.66 ml/m LA Vol (A4C):   81.4 ml 39.71 ml/m LA Biplane Vol: 74.5 ml 36.34 ml/m  AORTIC VALVE LVOT Vmax:   108.00 cm/s LVOT Vmean:  69.800 cm/s LVOT VTI:    0.235 m  AORTA Ao Root diam: 3.80 cm Ao Asc  diam:  3.90 cm MITRAL VALVE               TRICUSPID VALVE MV Area (PHT): 2.76 cm    TR Peak grad:   18.3 mmHg MV Decel Time: 275 msec    TR Vmax:        214.00 cm/s MV E velocity: 57.20 cm/s MV A velocity: 65.50 cm/s  SHUNTS MV E/A ratio:  0.87  Systemic VTI:  0.24 m                            Systemic Diam: 1.80 cm Mertie Moores MD Electronically signed by Mertie Moores MD Signature Date/Time: 10/19/2019/2:10:41 PM    Final      Labs:   Basic Metabolic Panel: Recent Labs  Lab 10/18/19 1348  NA 135  K 4.3  CL 99  CO2 23  GLUCOSE 203*  BUN 15  CREATININE 0.91  CALCIUM 9.5   GFR Estimated Creatinine Clearance: 79.7 mL/min (by C-G formula based on SCr of 0.91 mg/dL). Liver Function Tests: Recent Labs  Lab 10/18/19 1348  AST 21  ALT 18  ALKPHOS 51  BILITOT 1.2  PROT 7.7  ALBUMIN 4.4   No results for input(s): LIPASE, AMYLASE in the last 168 hours. No results for input(s): AMMONIA in the last 168 hours. Coagulation profile Recent Labs  Lab 10/18/19 1501  INR 1.0    CBC: Recent Labs  Lab 10/18/19 1348 10/19/19 0442  WBC 13.9* 10.4  NEUTROABS 11.8*  --   HGB 15.4 14.3  HCT 46.0 42.7  MCV 89.0 87.7  PLT 186 176   Cardiac Enzymes: Recent Labs  Lab 10/19/19 0442  CKTOTAL 115   BNP: Invalid input(s): POCBNP CBG: Recent Labs  Lab 10/18/19 2142 10/19/19 0648 10/19/19 1111 10/19/19 1620  GLUCAP 164* 172* 138* 115*   D-Dimer No results for input(s): DDIMER in the last 72 hours. Hgb A1c Recent Labs    10/19/19 0442  HGBA1C 7.0*   Lipid Profile Recent Labs    10/19/19 0442  CHOL 135  HDL 36*  LDLCALC 70  TRIG 144  CHOLHDL 3.8   Thyroid function studies No results for input(s): TSH, T4TOTAL, T3FREE, THYROIDAB in the last 72 hours.  Invalid input(s): FREET3 Anemia work up No results for input(s): VITAMINB12, FOLATE, FERRITIN, TIBC, IRON, RETICCTPCT in the last 72 hours. Microbiology Recent Results (from the past 240 hour(s))  SARS  Coronavirus 2 by RT PCR (hospital order, performed in [facility] hospital lab) Nasopharyngeal Nasopharyngeal Swab     Status: None   Collection Time: 10/18/19  3:21 PM   Specimen: Nasopharyngeal Swab  Result Value Ref Range Status   SARS Coronavirus 2 NEGATIVE NEGATIVE Final    Comment: (NOTE) SARS-CoV-2 target nucleic acids are NOT DETECTED. The SARS-CoV-2 RNA is generally detectable in upper and lower respiratory specimens during the acute phase of infection. The lowest concentration of SARS-CoV-2 viral copies this assay can detect is 250 copies / mL. A negative result does not preclude SARS-CoV-2 infection and should not be used as the sole basis for treatment or other patient management decisions.  A negative result may occur with improper specimen collection / handling, submission of specimen other than nasopharyngeal swab, presence of viral mutation(s) within the areas targeted by this assay, and inadequate number of viral copies (<250 copies / mL). A negative result must be combined with clinical observations, patient history, and epidemiological information. Fact Sheet for Patients:   StrictlyIdeas.no Fact Sheet for Healthcare Providers: BankingDealers.co.za This test is not yet approved or cleared  by the [place] FDA and has been authorized for detection and/or diagnosis of SARS-CoV-2 by FDA under an Emergency Use Authorization (EUA).  This EUA will remain in effect (meaning this test can be used) for the duration of the COVID-19 declaration under Section 564(b)(1) of the Act, 21 U.S.C. section 360bbb-3(b)(1), unless the  authorization is terminated or revoked sooner. Performed at [facility], [address], [place], [place] [address]      Discharge Instructions:   Discharge Instructions    Ambulatory referral to Neurology   Complete by: As directed    Follow up in stroke clinic at [facility] with Ihor Austin, NP in about 4 weeks. If not available, consider Dr. Delia Heady, Dr. Jamelle Rushing, or Dr. Naomie Dean.   For d/c likely today   Diet - low sodium heart healthy   Complete by: As directed    Diet Carb Modified   Complete by: As directed    Increase activity slowly   Complete by: As directed      Allergies as of 10/19/2019      Reactions   Amoxicillin-pot Clavulanate Other (See Comments)   Abdominal Pain, GI Intolerance; Tolerates amoxicillin   Hydrochlorothiazide Other (See Comments)   Other reaction(s): Dizziness   Losartan Nausea Only   headache   Lisinopril Nausea Only      Medication List    TAKE these medications   amLODipine 5 MG tablet Commonly known as: NORVASC Take 1 tablet (5 mg total) by mouth daily.   ascorbic acid 500 MG tablet Commonly known as: VITAMIN C Take 500 mg by mouth daily.   aspirin 81 MG EC tablet Take 1 tablet (81 mg total) by mouth daily for 21 days.   atorvastatin 10 MG tablet Commonly known as: LIPITOR Take 10 mg by mouth every evening.   clopidogrel 75 MG tablet Commonly known as: PLAVIX Take 1 tablet (75 mg total) by mouth daily.   Coenzyme Q10 100 MG capsule Take 1 capsule by mouth daily.   fexofenadine 180 MG tablet Commonly known as: ALLEGRA Take 180 mg by mouth daily.   fluticasone 50 MCG/ACT nasal spray Commonly known as: FLONASE Place 1 spray into both nostrils daily.   glipiZIDE 5 MG 24 hr tablet Commonly known as: GLUCOTROL XL Take 5 mg by mouth daily.   metFORMIN 500 MG 24 hr tablet Commonly known as: GLUCOPHAGE-XR Take 1 tablet (500 mg total) by mouth 2 (two) times daily. Start taking on: Oct 21, 2019   omeprazole 20 MG capsule Commonly known as: PRILOSEC Take 20 mg by mouth daily.   VITAMIN D PO Take 1 tablet by mouth daily.      Follow-up Information    [facility] Follow up in 4 week(s).   Specialty: Neurology Why: stroke clinic. office will  call with appt date and time.  Contact information: [address] [place] [address] [phone]       Tracey Harries, MD Follow up in 1 week(s).   Specialty: Family Medicine Why: BP check Contact information: [address] [place] [address] [phone]            Time coordinating discharge: 25 min  Signed:  Joseph Art DO  [facility] 10/20/2019, 10:31 AM

## 2019-11-16 ENCOUNTER — Other Ambulatory Visit: Payer: Self-pay

## 2019-11-16 ENCOUNTER — Encounter: Payer: Self-pay | Admitting: Adult Health

## 2019-11-16 ENCOUNTER — Ambulatory Visit (INDEPENDENT_AMBULATORY_CARE_PROVIDER_SITE_OTHER): Payer: Medicare Other | Admitting: Adult Health

## 2019-11-16 VITALS — BP 154/84 | HR 74 | Ht 68.0 in | Wt 221.0 lb

## 2019-11-16 DIAGNOSIS — G459 Transient cerebral ischemic attack, unspecified: Secondary | ICD-10-CM

## 2019-11-16 DIAGNOSIS — E118 Type 2 diabetes mellitus with unspecified complications: Secondary | ICD-10-CM

## 2019-11-16 DIAGNOSIS — E785 Hyperlipidemia, unspecified: Secondary | ICD-10-CM | POA: Diagnosis not present

## 2019-11-16 DIAGNOSIS — G43109 Migraine with aura, not intractable, without status migrainosus: Secondary | ICD-10-CM | POA: Diagnosis not present

## 2019-11-16 DIAGNOSIS — I10 Essential (primary) hypertension: Secondary | ICD-10-CM | POA: Diagnosis not present

## 2019-11-16 NOTE — Progress Notes (Signed)
[facility] [address] [place]. [place] [address] [phone]       HOSPITAL FOLLOW UP NOTE  [name] Date of Birth:  1945/08/05 Medical Record Number:  [id number]   Reason for Referral:  hospital stroke follow up    SUBJECTIVE:   CHIEF COMPLAINT:  Chief Complaint  Patient presents with  . Follow-up    hospital fu, treatment rm, with wife, pt states he is doing well, checks bp at home regularly    HPI:   Thomas Knapp is a 74 y.o. male with of DB, HTN, HLD  who presented on 10/18/2019 with severe HA, HTN 190/90 and expressive aphasia likely in setting of possible TIA vs first episode of complicated migraine.  Stroke work-up largely unremarkable with MRI negative for acute abnormality.  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  2D echo EF of 55 to 60% without cardiac source of embolus identified, LA mildly dilated and aortic dilation.  Recommended DAPT for 3 weeks then Plavix alone.  Hypertensive urgency with BP as high as 230/107 with reported noncompliance with prescribed antihypertensives.  Initiated amlodipine 5 mg daily with improvement of BP during admission and long-term BP goal normotensive range and advised outpatient follow-up with PCP.  LDL 70 previously on atorvastatin 10 mg daily and recommended increasing dosage to 80 mg daily.  Controlled DM with A1c 7.0.  Other stroke risk factors include advanced age, history of EtOH use and obesity but no prior history of stroke or migraines.  Evaluated by therapies and recommended discharge home without further therapy recommendations.   Possible TIA versus first episode of complicated migraine  CT head No acute abnormality. Small vessel disease. Atrophy.   MRI  No acute abnormality. Small vessel disease. Atrophy.    CTA head & neck No acute abnormality. Aortic arch with ulcerated soft plaque. L VA origin stenosis. Aortic  atherosclerosis.   2D Echo EF 55-60%. No source of embolus. LA mildlu dilated. Aortic dilatation.   LDL 70  HgbA1c 7.0  Lovenox 40 mg sq daily for VTE prophylaxis  No antithrombotic prior to admission, now on aspirin 325 mg daily. Reduce aspirin to 81 and add plavix 75 x 3 weeks then plavix alone  Therapy recommendations:  No therapy needs  Disposition:  Return home w/ wife  Today, 11/16/2019, Thomas Knapp is being seen for hospital follow-up accompanied by his wife.  He has been stable since discharge approximately 4 weeks ago without recurrent or new stroke/TIA symptoms.  He denies any reoccurring headaches.  Completed 3 weeks DAPT and continues on Plavix alone without bleeding or bruising.  Continues on atorvastatin without myalgias.  Blood pressure today 154/84 - monitors at home and typically 130s/70-80s.  Amlodipine dosage increased by PCP 3 weeks ago from 5 mg to 10 mg.  He endorses ongoing compliance.  Glucose levels monitored at home via free style device with levels stable.  Continues to follow closely with PCP for HTN, HLD and DM management.  Wife does report occasional daytime napping and mild position dependent snoring.  Denies daytime fatigue, insomnia or witnessed apnea.  He has not previously underwent sleep study.  No concerns at this time.  Patient discussed events that led to hospital admission.  Reports the Thursday prior to symptom onset, he was spraying weed killer around house and wind blew some in face but he didn't wash it off -he is unsure if some was ingested.  He was doing well until Saturday around brunch time (11 AM) when he sat down to eat and felt as well his whole body "was just off" and noticed difficulty getting words out correctly even though he knew what he wanted to say.  He also experienced right fingertip numbness.  Denies symptoms of visual impairment, weakness, cognitive impairment, imbalance or facial droop.  Around 2 PM, he started to experience a severe  debilitating headache in which he associated with likely inner ear infection.  He took a aspirin and went to bed around 8 PM.  Upon awakening, he continued to experience severe headache but reports resolution of speech and numbness sensation.  He proceeded to urgent care that Sunday for treatment of ongoing headache which he felt was related to inner ear infection.  Reports blood pressure severely elevated and was advised to proceed to ED for further evaluation.  Denies prior history of migraine and initially family history but wife does report his son suffers with occasional migraines.      ROS:   14 system review of systems performed and negative with exception of no complaints  PMH:  Past Medical History:  Diagnosis Date  . Diabetes mellitus without complication (HCC)   . High cholesterol   . HTN (hypertension)     PSH:  Past Surgical History:  Procedure Laterality Date  . COLONOSCOPY      Social History:  Social History   Socioeconomic History  . Marital status: Married    Spouse name: Not on file  . Number of children: Not on file  . Years of education: Not on file  . Highest education level: Not on file  Occupational History  . Not on file  Tobacco Use  . Smoking status: Never Smoker  . Smokeless tobacco: Never Used  Vaping Use  . Vaping Use: Never used  Substance and Sexual Activity  . Alcohol use: Not Currently  . Drug use: Not Currently  . Sexual activity: Not on file  Other Topics Concern  . Not on file  Social History Narrative  . Not on file   Social Determinants of Health   Financial Resource Strain:   . Difficulty of Paying Living Expenses:   Food Insecurity:   . Worried About Programme researcher, broadcasting/film/video in the Last Year:   . Barista in the Last Year:   Transportation Needs:   . Freight forwarder (Medical):   Marland Kitchen Lack of Transportation (Non-Medical):   Physical Activity:   . Days of Exercise per Week:   . Minutes of Exercise per Session:    Stress:   . Feeling of Stress :   Social Connections:   . Frequency of Communication with Friends and Family:   . Frequency of Social Gatherings with Friends and Family:   . Attends Religious Services:   . Active Member of Clubs or Organizations:   . Attends Banker Meetings:   Marland Kitchen Marital Status:   Intimate Partner Violence:   . Fear of Current or Ex-Partner:   . Emotionally Abused:   Marland Kitchen Physically Abused:   . Sexually Abused:     Family History:  Family History  Problem Relation Age of Onset  . Stroke Mother     Medications:   Current Outpatient Medications on File Prior to Visit  Medication Sig Dispense Refill  . amLODipine (NORVASC) 5 MG tablet Take 1 tablet (5 mg total) by mouth daily. 30 tablet 0  . ascorbic acid (VITAMIN  C) 500 MG tablet Take 500 mg by mouth daily.    Marland Kitchen atorvastatin (LIPITOR) 10 MG tablet Take 10 mg by mouth every evening.     . clopidogrel (PLAVIX) 75 MG tablet Take 1 tablet (75 mg total) by mouth daily. 90 tablet 0  . Coenzyme Q10 100 MG capsule Take 1 capsule by mouth daily.    . fexofenadine (ALLEGRA) 180 MG tablet Take 180 mg by mouth daily.    . fluticasone (FLONASE) 50 MCG/ACT nasal spray Place 1 spray into both nostrils daily.    Marland Kitchen glipiZIDE (GLUCOTROL XL) 5 MG 24 hr tablet Take 5 mg by mouth daily.    . metFORMIN (GLUCOPHAGE-XR) 500 MG 24 hr tablet Take 1 tablet (500 mg total) by mouth 2 (two) times daily.    Marland Kitchen omeprazole (PRILOSEC) 20 MG capsule Take 20 mg by mouth daily.    Marland Kitchen VITAMIN D PO Take 1 tablet by mouth daily.     No current facility-administered medications on file prior to visit.    Allergies:   Allergies  Allergen Reactions  . Amoxicillin-Pot Clavulanate Other (See Comments)    Abdominal Pain, GI Intolerance; Tolerates amoxicillin  . Hydrochlorothiazide Other (See Comments)    Other reaction(s): Dizziness  . Losartan Nausea Only    headache    . Lisinopril Nausea Only      OBJECTIVE:  Physical  Exam  Vitals:   11/16/19 1251  BP: (!) 154/84  Pulse: 74  Weight: 221 lb (100.2 kg)  Height: 5\' 8"  (1.727 m)   Body mass index is 33.6 kg/m. No exam data present  No flowsheet data found.   General: well developed, well nourished,  pleasant elderly Caucasian male, seated, in no evident distress Head: head normocephalic and atraumatic.   Neck: supple with no carotid or supraclavicular bruits Cardiovascular: regular rate and rhythm, no murmurs Musculoskeletal: no deformity Skin:  no rash/petichiae Vascular:  Normal pulses all extremities   Neurologic Exam Mental Status: Awake and fully alert. Oriented to place and time. Recent and remote memory intact. Attention span, concentration and fund of knowledge appropriate. Mood and affect appropriate.  Cranial Nerves: Fundoscopic exam reveals sharp disc margins. Pupils equal, briskly reactive to light. Extraocular movements full without nystagmus. Visual fields full to confrontation. Hearing intact. Facial sensation intact. Face, tongue, palate moves normally and symmetrically.  Motor: Normal bulk and tone. Normal strength in all tested extremity muscles. Sensory.: intact to touch , pinprick , position and vibratory sensation.  Coordination: Rapid alternating movements normal in all extremities. Finger-to-nose and heel-to-shin performed accurately bilaterally. Gait and Station: Arises from chair without difficulty. Stance is normal. Gait demonstrates normal stride length and balance Reflexes: 1+ and symmetric. Toes downgoing.     NIHSS  0 Modified Rankin  0     ASSESSMENT: Thomas Knapp is a 74 y.o. year old male presented with severe headache, hypertensive urgency, right fingertip numbness and expressive aphasia on 10/18/2019 likely in setting of possible TIA vs first episode of complicated migraine.  Vascular risk factors include HTN, HLD and DM.  He has been stable since discharge without reoccurring stroke/TIA symptoms or  headaches     PLAN:  1. Possible TIA: Continue clopidogrel 75 mg daily  and atorvastatin for secondary stroke prevention. Maintain strict control of hypertension with blood pressure goal below 130/90, diabetes with hemoglobin A1c goal below 6.5% and cholesterol with LDL cholesterol (bad cholesterol) goal below 70 mg/dL.  I also advised the patient to eat a healthy  diet with plenty of whole grains, cereals, fruits and vegetables, exercise regularly with at least 30 minutes of continuous activity daily and maintain ideal body weight. 2. Possible complicated migraine: Discussion regarding possible complicated migraine which could have been responsible for recent episode.  No prior history of migraines but does report his son has occasional migraines.  He has not had any reoccurring headaches and advised to continue to monitor at this time without indication for medication management 3. HTN: Stable.  Continue to follow with PCP for monitoring and management 4. HLD: Recent LDL 70.  Continuation of atorvastatin and continue to follow with PCP for prescribing, monitoring and management 5. DMII: Recent A1c 7.0.  Continue to follow with PCP for monitoring and management    Follow up in 6 months or call earlier if needed   I spent 45 minutes of face-to-face and non-face-to-face time with patient and wife.  This included previsit chart review, lab review, study review, order entry, electronic health record documentation, patient education regarding recent hospitalization, TIA vs complicated migraine, importance of managing stroke risk factors and answered all questions to patient and wifes satisfaction     Ihor Austin, AGNP-BC  [facility] [address] [place], [place] [address]  Phone [phone] Fax [phone] Note: This document was prepared with digital dictation and possible smart phrase technology. Any transcriptional errors that result from this  process are unintentional.

## 2019-11-16 NOTE — Patient Instructions (Addendum)
Continue clopidogrel 75 mg daily  and atorvastatin  for secondary stroke prevention  Continue to follow up with PCP regarding cholesterol, blood pressure and diabetes management   Continue to monitor blood pressure at home  Monitor for symptoms of sleep apnea such as day time fatigue, falling asleep easily, loud snoring, someone has told you that you stop breathing while you are sleeping and difficulty sleeping at night. Untreated sleep apnea puts you at higher risk of stroke and heart disease  Maintain strict control of hypertension with blood pressure goal below 130/90, diabetes with hemoglobin A1c goal below 6.5% and cholesterol with LDL cholesterol (bad cholesterol) goal below 70 mg/dL. I also advised the patient to eat a healthy diet with plenty of whole grains, cereals, fruits and vegetables, exercise regularly and maintain ideal body weight.  Followup in the future with me in 6 months or call earlier if needed       Thank you for coming to see [place] at [facility]. I hope we have been able to provide you high quality care today.  You may receive a patient satisfaction survey over the next few weeks. We would appreciate your feedback and comments so that we may continue to improve ourselves and the health of our patients.

## 2019-11-16 NOTE — Progress Notes (Signed)
I agree with the above plan 

## 2020-05-02 ENCOUNTER — Ambulatory Visit: Payer: Medicare Other | Admitting: Adult Health

## 2020-07-04 ENCOUNTER — Encounter: Payer: Self-pay | Admitting: Adult Health

## 2020-07-04 ENCOUNTER — Ambulatory Visit: Payer: Medicare Other | Admitting: Adult Health

## 2020-07-04 NOTE — Progress Notes (Deleted)
[facility] [address] [place]. [place] [address] [phone]       OFFICE FOLLOW UP NOTE  [name] Date of Birth:  [birth year] Medical Record Number:  [id number]   Reason for Referral: TIA vs complicated migraine follow up    SUBJECTIVE:   CHIEF COMPLAINT:  No chief complaint on file.   HPI:   Today, 07/04/2020, Thomas Knapp returns for TIA vs complicated migraine follow-up.  Stable from stroke standpoint without new or reoccurring stroke/TIA symptoms or migrainous type symptoms.   Remains on Plavix and atorvastatin 10 mg daily without side effects.  Blood pressure today ***.  Glucose levels ***.     History provided for reference purposes only Initial visit 11/16/2019 JM: Thomas Knapp is being seen for hospital follow-up accompanied by his wife.  He has been stable since discharge approximately 4 weeks ago without recurrent or new stroke/TIA symptoms.  He denies any reoccurring headaches.  Completed 3 weeks DAPT and continues on Plavix alone without bleeding or bruising.  Continues on atorvastatin without myalgias.  Blood pressure today 154/84 - monitors at home and typically 130s/70-80s.  Amlodipine dosage increased by PCP 3 weeks ago from 5 mg to 10 mg.  He endorses ongoing compliance.  Glucose levels monitored at home via free style device with levels stable.  Continues to follow closely with PCP for HTN, HLD and DM management.  Wife does report occasional daytime napping and mild position dependent snoring.  Denies daytime fatigue, insomnia or witnessed apnea.  He has not previously underwent sleep study.  No concerns at this time.  Patient discussed events that led to hospital admission.  Reports the Thursday prior to symptom onset, he was spraying weed killer around house and wind blew some in face but he didn't wash it off -he is unsure if some was ingested.  He was doing well until Saturday around brunch time (11 AM) when he sat down to eat and felt as well  his whole body "was just off" and noticed difficulty getting words out correctly even though he knew what he wanted to say.  He also experienced right fingertip numbness.  Denies symptoms of visual impairment, weakness, cognitive impairment, imbalance or facial droop.  Around 2 PM, he started to experience a severe debilitating headache in which he associated with likely inner ear infection.  He took a aspirin and went to bed around 8 PM.  Upon awakening, he continued to experience severe headache but reports resolution of speech and numbness sensation.  He proceeded to urgent care that Sunday for treatment of ongoing headache which he felt was related to inner ear infection.  Reports blood pressure severely elevated and was advised to proceed to ED for further evaluation.  Denies prior history of migraine and initially family history but wife does report his son suffers with occasional migraines.   Stroke admission 10/18/2019 Thomas Knapp is a 75 y.o. male with of DB, HTN, HLD  who presented on 10/18/2019 with severe HA, HTN 190/90 and expressive aphasia likely in setting of possible TIA vs first episode of complicated migraine.  Stroke work-up largely unremarkable with MRI negative for acute abnormality.  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  2D echo EF of 55 to 60% without cardiac source of embolus identified, LA mildly dilated and aortic dilation.  Recommended DAPT for 3 weeks then Plavix alone.  Hypertensive urgency with BP as high as  230/107 with reported noncompliance with prescribed antihypertensives.  Initiated amlodipine 5 mg daily with improvement of BP during admission and long-term BP goal normotensive range and advised outpatient follow-up with PCP.  LDL 70 previously on atorvastatin 10 mg daily and recommended increasing dosage to 80 mg daily.  Controlled DM with A1c 7.0.  Other stroke risk factors include advanced  age, history of EtOH use and obesity but no prior history of stroke or migraines.  Evaluated by therapies and recommended discharge home without further therapy recommendations.   Possible TIA versus first episode of complicated migraine  CT head No acute abnormality. Small vessel disease. Atrophy.   MRI  No acute abnormality. Small vessel disease. Atrophy.    CTA head & neck No acute abnormality. Aortic arch with ulcerated soft plaque. L VA origin stenosis. Aortic atherosclerosis.   2D Echo EF 55-60%. No source of embolus. LA mildlu dilated. Aortic dilatation.   LDL 70  HgbA1c 7.0  Lovenox 40 mg sq daily for VTE prophylaxis  No antithrombotic prior to admission, now on aspirin 325 mg daily. Reduce aspirin to 81 and add plavix 75 x 3 weeks then plavix alone  Therapy recommendations:  No therapy needs  Disposition:  Return home w/ wife        ROS:   14 system review of systems performed and negative with exception of no complaints  PMH:  Past Medical History:  Diagnosis Date  . Diabetes mellitus without complication (HCC)   . High cholesterol   . HTN (hypertension)     PSH:  Past Surgical History:  Procedure Laterality Date  . COLONOSCOPY      Social History:  Social History   Socioeconomic History  . Marital status: Married    Spouse name: Not on file  . Number of children: Not on file  . Years of education: Not on file  . Highest education level: Not on file  Occupational History  . Not on file  Tobacco Use  . Smoking status: Never Smoker  . Smokeless tobacco: Never Used  Vaping Use  . Vaping Use: Never used  Substance and Sexual Activity  . Alcohol use: Not Currently  . Drug use: Not Currently  . Sexual activity: Not on file  Other Topics Concern  . Not on file  Social History Narrative  . Not on file   Social Determinants of Health   Financial Resource Strain: Not on file  Food Insecurity: Not on file  Transportation Needs: Not on file   Physical Activity: Not on file  Stress: Not on file  Social Connections: Not on file  Intimate Partner Violence: Not on file    Family History:  Family History  Problem Relation Age of Onset  . Stroke Mother     Medications:   Current Outpatient Medications on File Prior to Visit  Medication Sig Dispense Refill  . amLODipine (NORVASC) 5 MG tablet Take 1 tablet (5 mg total) by mouth daily. 30 tablet 0  . ascorbic acid (VITAMIN C) 500 MG tablet Take 500 mg by mouth daily.    Marland Kitchen atorvastatin (LIPITOR) 10 MG tablet Take 10 mg by mouth every evening.     . clopidogrel (PLAVIX) 75 MG tablet Take 1 tablet (75 mg total) by mouth daily. 90 tablet 0  . Coenzyme Q10 100 MG capsule Take 1 capsule by mouth daily.    . fexofenadine (ALLEGRA) 180 MG tablet Take 180 mg by mouth daily.    . fluticasone (FLONASE) 50 MCG/ACT  nasal spray Place 1 spray into both nostrils daily.    Marland Kitchen glipiZIDE (GLUCOTROL XL) 5 MG 24 hr tablet Take 5 mg by mouth daily.    . metFORMIN (GLUCOPHAGE-XR) 500 MG 24 hr tablet Take 1 tablet (500 mg total) by mouth 2 (two) times daily.    Marland Kitchen omeprazole (PRILOSEC) 20 MG capsule Take 20 mg by mouth daily.    Marland Kitchen VITAMIN D PO Take 1 tablet by mouth daily.     No current facility-administered medications on file prior to visit.    Allergies:   Allergies  Allergen Reactions  . Amoxicillin-Pot Clavulanate Other (See Comments)    Abdominal Pain, GI Intolerance; Tolerates amoxicillin  . Hydrochlorothiazide Other (See Comments)    Other reaction(s): Dizziness  . Losartan Nausea Only    headache    . Lisinopril Nausea Only      OBJECTIVE:  Physical Exam  There were no vitals filed for this visit. There is no height or weight on file to calculate BMI. No exam data present  No flowsheet data found.   General: well developed, well nourished,  pleasant elderly Caucasian male, seated, in no evident distress Head: head normocephalic and atraumatic.   Neck: supple with no  carotid or supraclavicular bruits Cardiovascular: regular rate and rhythm, no murmurs Musculoskeletal: no deformity Skin:  no rash/petichiae Vascular:  Normal pulses all extremities   Neurologic Exam Mental Status: Awake and fully alert. Oriented to place and time. Recent and remote memory intact. Attention span, concentration and fund of knowledge appropriate. Mood and affect appropriate.  Cranial Nerves: Pupils equal, briskly reactive to light. Extraocular movements full without nystagmus. Visual fields full to confrontation. Hearing intact. Facial sensation intact. Face, tongue, palate moves normally and symmetrically.  Motor: Normal bulk and tone. Normal strength in all tested extremity muscles. Sensory.: intact to touch , pinprick , position and vibratory sensation.  Coordination: Rapid alternating movements normal in all extremities. Finger-to-nose and heel-to-shin performed accurately bilaterally. Gait and Station: Arises from chair without difficulty. Stance is normal. Gait demonstrates normal stride length and balance Reflexes: 1+ and symmetric. Toes downgoing.        ASSESSMENT: Thomas Knapp is a 74 y.o. year old male presented with severe headache, hypertensive urgency, right fingertip numbness and expressive aphasia on 10/18/2019 likely in setting of possible TIA vs first episode of complicated migraine.  Vascular risk factors include HTN, HLD and DM.  He has been stable since discharge without reoccurring stroke/TIA symptoms or headaches     PLAN:  1. Possible TIA: Continue clopidogrel 75 mg daily  and atorvastatin for secondary stroke prevention.  Discussed secondary stroke prevention measures and importance of close PCP follow-up for aggressive stroke risk factor management 2. Possible complicated migraine: Discussion regarding possible complicated migraine which could have been responsible for recent episode.  No prior history of migraines but does report his son has  occasional migraines.  He has not had any reoccurring headaches and advised to continue to monitor at this time without indication for medication management 3. HTN: BP goal<130/90.  Stable on amlodipine per PCP 4. HLD: LDL goal <70.  On atorvastatin 10 mg daily per PCP 5. DMII: A1c goal <7.  Recent A1c 7.0.  On glipizide and Metformin per PCP    Follow up in 6 months or call earlier if needed   CC:  [facility] provider: Dr. Casimer Bilis, Onalee Hua, MD    I spent 45 minutes of face-to-face and non-face-to-face time with patient and wife.  This included previsit chart review, lab review, study review, order entry, electronic health record documentation, patient education regarding recent hospitalization, TIA vs complicated migraine, importance of managing stroke risk factors and answered all questions to patient and wifes satisfaction   Ihor Austin, AGNP-BC  [facility] [address] [place], [place] [address]  Phone [phone] Fax [phone] Note: This document was prepared with digital dictation and possible smart phrase technology. Any transcriptional errors that result from this process are unintentional.

## 2022-05-13 IMAGING — MR MR HEAD W/O CM
6 of 10 series · 27 of 48 positions shown · non-contrast
Comparison: Prior head CT from earlier the same day.

CLINICAL DATA: Initial evaluation for acute headache, TIA.

EXAM:
MRI HEAD WITHOUT CONTRAST
TECHNIQUE: Multiplanar, multiecho pulse sequences of the brain and surrounding
structures were obtained without intravenous contrast.

[Series 2: DWI · axial · 3.0mm · 0.94mm/px · z∈[-35,+112]mm · 8 of 100 slices shown (1 of 2)]
[im 1/100]
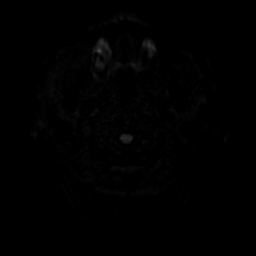
[im 12/100]
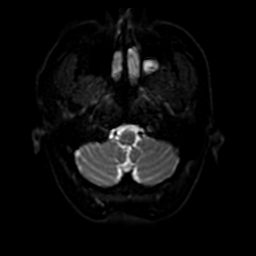
[im 34/100]
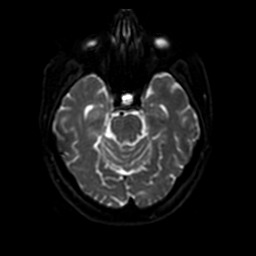
[im 45/100]
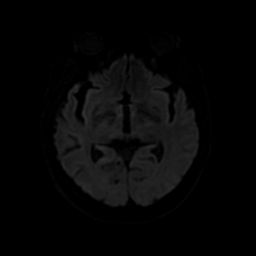
[im 56/100]
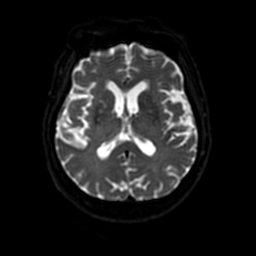
[im 67/100]
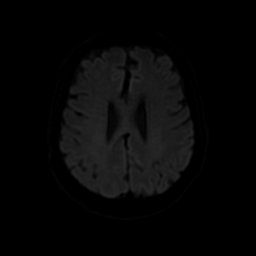
[im 89/100]
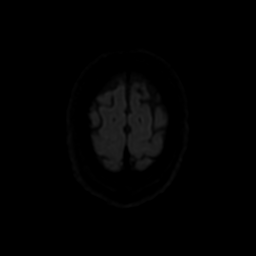
[im 100/100]
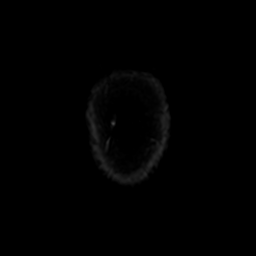

[Series 3: DWI · coronal · 4.0mm · 0.94mm/px · 7 of 74 slices shown (2 of 2)]
[im 1/74]
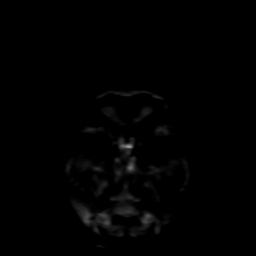
[im 13/74]
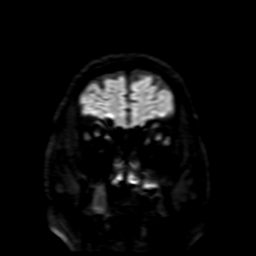
[im 25/74]
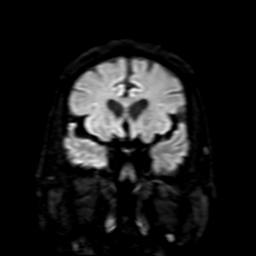
[im 37/74]
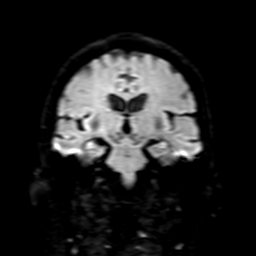
[im 49/74]
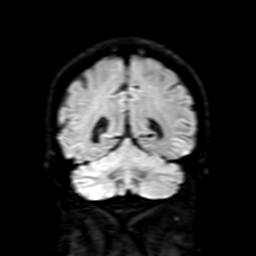
[im 61/74]
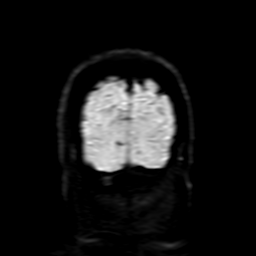
[im 74/74]
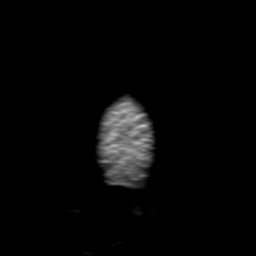

[Series 4: FLAIR · sagittal · 5.0mm · 0.23mm/px · 2 of 23 slices shown (1 of 2)]
[im 1/23]
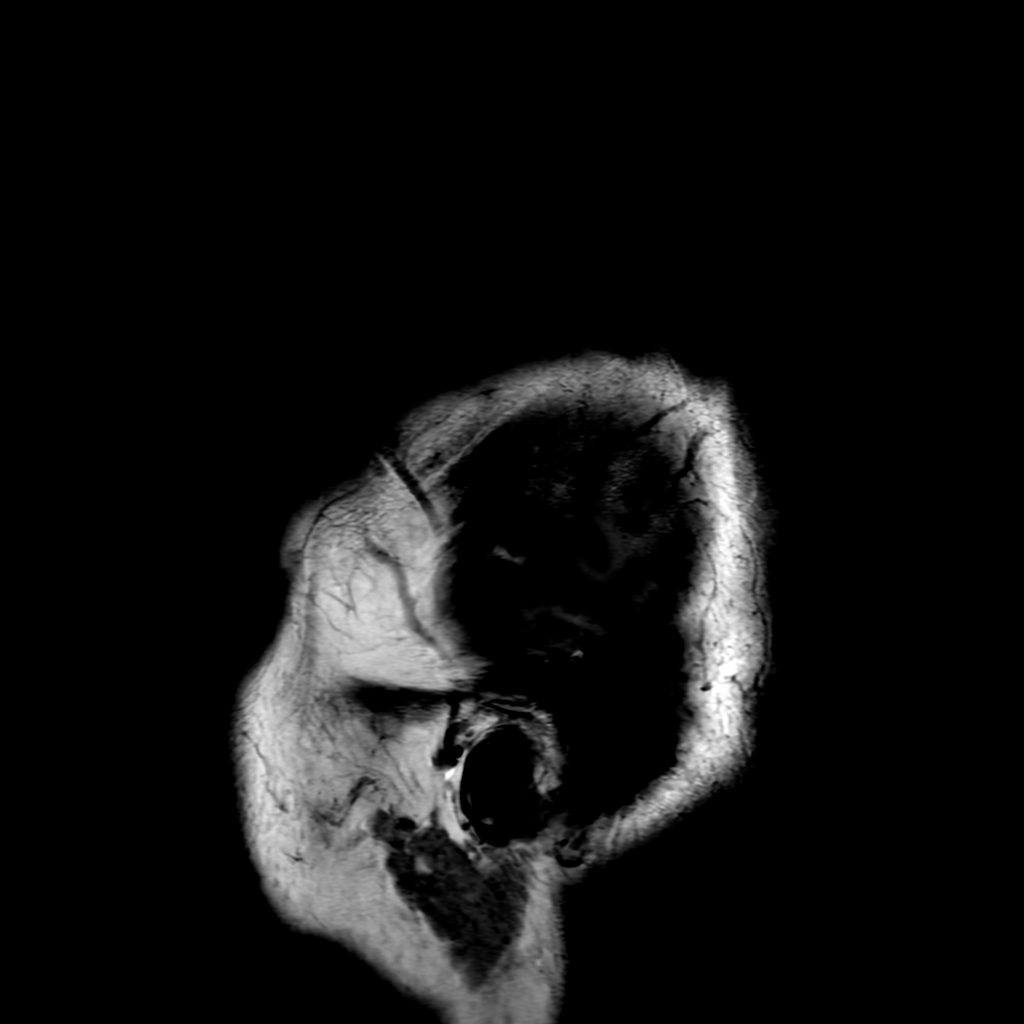
[im 23/23]
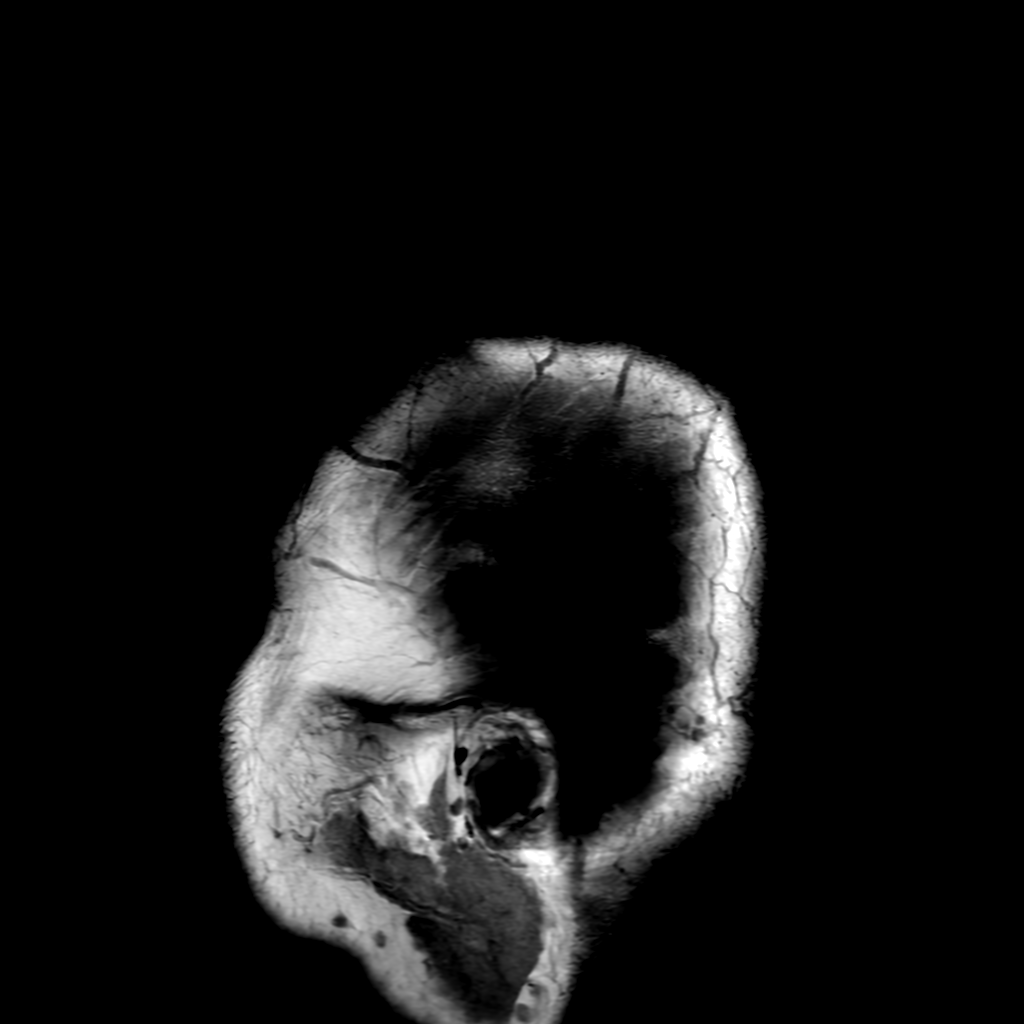

[Series 6: FLAIR · axial · 5.0mm · 0.47mm/px · z∈[-49,+100]mm · 2 of 26 slices shown (2 of 2)]
[im 1/26]
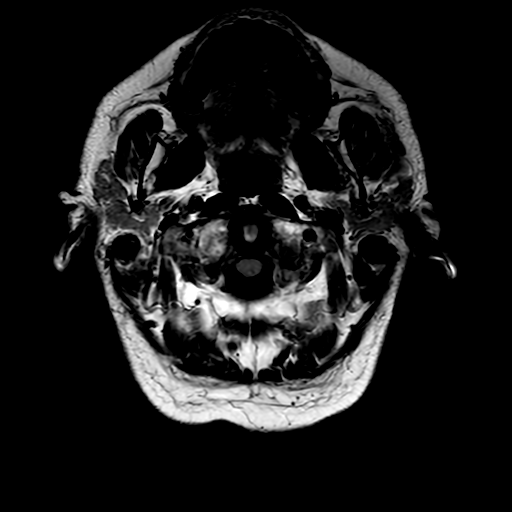
[im 26/26]
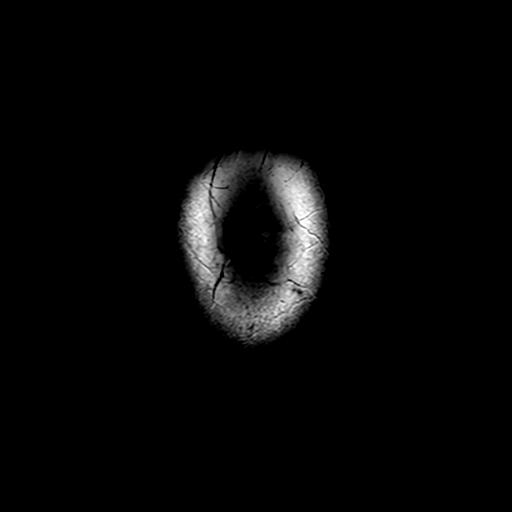

[Series 250: ADC · axial · 3.0mm · 0.94mm/px · z∈[-35,+112]mm · 5 of 50 slices shown (1 of 2)]
[im 1/50]
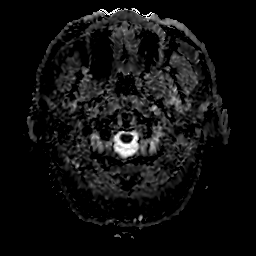
[im 13/50]
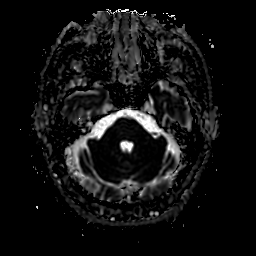
[im 25/50]
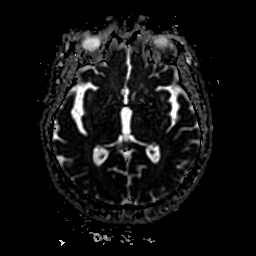
[im 37/50]
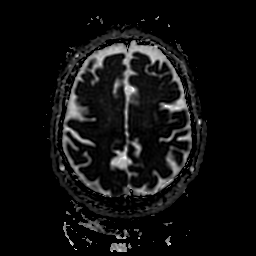
[im 50/50]
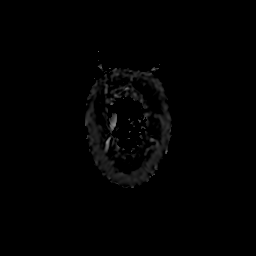

[Series 350: ADC · coronal · 4.0mm · 0.94mm/px · 3 of 37 slices shown (2 of 2)]
[im 1/37]
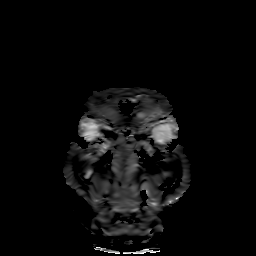
[im 19/37]
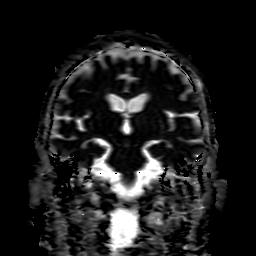
[im 37/37]
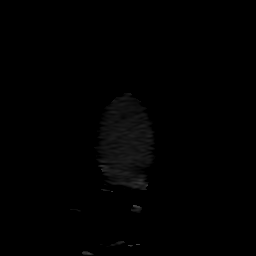

[27 of 48 positions shown; findings below may reference images not displayed]

FINDINGS: Brain: Mild age-related cerebral atrophy. Patchy T2/FLAIR
hyperintensity within the periventricular deep white matter both
cerebral hemispheres most consistent with chronic small vessel
ischemic disease. Mild patchy involvement of the pons and
cerebellum.

No abnormal foci of restricted diffusion to suggest acute or
subacute ischemia. Gray-white matter differentiation maintained. No
encephalomalacia to suggest chronic cortical infarction. No evidence
for acute intracranial hemorrhage. Small focus of susceptibility
artifact within the central pons related to a parenchymal
calcification as seen on prior CT. No other evidence for acute or
chronic intracranial hemorrhage.

No mass lesion, midline shift or mass effect. No hydrocephalus or
extra-axial fluid collection. No made of a partially empty sella.
Midline structures intact.

Vascular: Major intracranial vascular flow voids are well
maintained.

Skull and upper cervical spine: Craniocervical junction within
normal limits. Bone marrow signal intensity normal. No scalp soft
tissue abnormality.

Sinuses/Orbits: Globes and orbital soft tissues within normal
limits. Left maxillary sinus retention cyst noted. Paranasal sinuses
are otherwise clear. Trace left mastoid effusion noted, of doubtful
significance. Inner ear structures grossly normal.

Other: None.
IMPRESSION: 1. No acute intracranial abnormality.
2. Mild age-related cerebral atrophy with chronic small vessel
ischemic disease.
